# Patient Record
Sex: Male | Born: 1970 | Race: White | Hispanic: No | Smoking: Never smoker
Health system: Southern US, Community
[De-identification: ages and names within clinical notes are randomized; demographics above are authoritative.]

## PROBLEM LIST (undated history)

## (undated) DIAGNOSIS — I10 Essential (primary) hypertension: Secondary | ICD-10-CM

## (undated) DIAGNOSIS — M549 Dorsalgia, unspecified: Secondary | ICD-10-CM

## (undated) HISTORY — PX: ORTHOPEDIC SURGERY: SHX850

## (undated) HISTORY — PX: OTHER SURGICAL HISTORY: SHX169

---

## 2003-08-17 ENCOUNTER — Emergency Department (HOSPITAL_COMMUNITY): Admission: AD | Admit: 2003-08-17 | Discharge: 2003-08-17 | Payer: Self-pay | Admitting: Family Medicine

## 2004-01-27 ENCOUNTER — Emergency Department (HOSPITAL_COMMUNITY): Admission: EM | Admit: 2004-01-27 | Discharge: 2004-01-27 | Payer: Self-pay | Admitting: Family Medicine

## 2004-04-24 ENCOUNTER — Emergency Department (HOSPITAL_COMMUNITY): Admission: EM | Admit: 2004-04-24 | Discharge: 2004-04-24 | Payer: Self-pay | Admitting: Family Medicine

## 2006-04-20 ENCOUNTER — Emergency Department (HOSPITAL_COMMUNITY): Admission: EM | Admit: 2006-04-20 | Discharge: 2006-04-20 | Payer: Self-pay | Admitting: Emergency Medicine

## 2013-04-20 ENCOUNTER — Emergency Department (HOSPITAL_COMMUNITY): Payer: 59

## 2013-04-20 ENCOUNTER — Emergency Department (HOSPITAL_COMMUNITY)
Admission: EM | Admit: 2013-04-20 | Discharge: 2013-04-20 | Disposition: A | Discharge status: Home / Self Care | Payer: 59 | Attending: Emergency Medicine | Admitting: Emergency Medicine

## 2013-04-20 ENCOUNTER — Encounter (HOSPITAL_COMMUNITY): Payer: Self-pay

## 2013-04-20 DIAGNOSIS — M549 Dorsalgia, unspecified: Secondary | ICD-10-CM

## 2013-04-20 DIAGNOSIS — M545 Low back pain, unspecified: Secondary | ICD-10-CM | POA: Insufficient documentation

## 2013-04-20 DIAGNOSIS — Z79899 Other long term (current) drug therapy: Secondary | ICD-10-CM | POA: Insufficient documentation

## 2013-04-20 DIAGNOSIS — R110 Nausea: Secondary | ICD-10-CM | POA: Insufficient documentation

## 2013-04-20 HISTORY — DX: Dorsalgia, unspecified: M54.9

## 2013-04-20 LAB — CBC WITH DIFFERENTIAL/PLATELET
Hemoglobin: 13.8 g/dL (ref 13.0–17.0)
Lymphocytes Relative: 29 % (ref 12–46)
Lymphs Abs: 2.1 10*3/uL (ref 0.7–4.0)
MCH: 30.5 pg (ref 26.0–34.0)
Monocytes Relative: 9 % (ref 3–12)
Neutro Abs: 4 10*3/uL (ref 1.7–7.7)
Neutrophils Relative %: 56 % (ref 43–77)
Platelets: 196 10*3/uL (ref 150–400)
RBC: 4.53 MIL/uL (ref 4.22–5.81)
WBC: 7.2 10*3/uL (ref 4.0–10.5)

## 2013-04-20 LAB — BASIC METABOLIC PANEL
BUN: 13 mg/dL (ref 6–23)
CO2: 27 mEq/L (ref 19–32)
Chloride: 104 mEq/L (ref 96–112)
Glucose, Bld: 88 mg/dL (ref 70–99)
Potassium: 3.5 mEq/L (ref 3.5–5.1)
Sodium: 140 mEq/L (ref 135–145)

## 2013-04-20 LAB — URINALYSIS, ROUTINE W REFLEX MICROSCOPIC
Bilirubin Urine: NEGATIVE
Glucose, UA: NEGATIVE mg/dL
Specific Gravity, Urine: >1.03 — ABNORMAL HIGH (ref 1.005–1.030)
Urobilinogen, UA: 0.2 mg/dL (ref 0.0–1.0)

## 2013-04-20 LAB — URINE MICROSCOPIC-ADD ON

## 2013-04-20 MED ORDER — HYDROMORPHONE HCL PF 1 MG/ML IJ SOLN
1.0000 mg | Freq: Once | INTRAMUSCULAR | Status: AC
  Administered 2013-04-20: 1 mg via INTRAVENOUS

## 2013-04-20 MED ORDER — HYDROMORPHONE HCL PF 1 MG/ML IJ SOLN
INTRAMUSCULAR | Status: AC
  Administered 2013-04-20: 1 mg
  Filled 2013-04-20: qty 1

## 2013-04-20 MED ORDER — HYDROMORPHONE HCL 2 MG PO TABS: 2.0000 mg | ORAL_TABLET | ORAL | Status: DC | PRN

## 2013-04-20 MED ORDER — DIAZEPAM 5 MG PO TABS: 5.0000 mg | ORAL_TABLET | Freq: Two times a day (BID) | ORAL | Status: DC

## 2013-04-20 MED ORDER — SODIUM CHLORIDE 0.9 % IV BOLUS (SEPSIS)
1000.0000 mL | Freq: Once | INTRAVENOUS | Status: AC
  Administered 2013-04-20: 1000 mL via INTRAVENOUS

## 2013-04-20 MED ORDER — ONDANSETRON HCL 4 MG/2ML IJ SOLN: 4.0000 mg | Freq: Once | INTRAMUSCULAR | Status: DC

## 2013-04-20 MED ORDER — METHYLPREDNISOLONE 4 MG PO KIT: PACK | ORAL | Status: DC

## 2013-04-20 MED ORDER — CYCLOBENZAPRINE HCL 10 MG PO TABS: 10.0000 mg | ORAL_TABLET | Freq: Once | ORAL | Status: DC

## 2013-04-20 MED ORDER — HYDROMORPHONE HCL PF 1 MG/ML IJ SOLN
INTRAMUSCULAR | Status: AC
  Filled 2013-04-20: qty 1

## 2013-04-20 MED ORDER — DIAZEPAM 5 MG PO TABS
5.0000 mg | ORAL_TABLET | Freq: Once | ORAL | Status: AC
  Administered 2013-04-20: 5 mg via ORAL
  Filled 2013-04-20: qty 1

## 2013-04-20 MED ORDER — ONDANSETRON HCL 4 MG/2ML IJ SOLN
INTRAMUSCULAR | Status: AC
  Filled 2013-04-20: qty 2

## 2013-04-20 MED ORDER — HYDROMORPHONE HCL PF 1 MG/ML IJ SOLN: 1.0000 mg | Freq: Once | INTRAMUSCULAR | Status: DC

## 2013-04-20 MED ORDER — ONDANSETRON HCL 4 MG/2ML IJ SOLN
4.0000 mg | Freq: Once | INTRAMUSCULAR | Status: AC
  Administered 2013-04-20: 4 mg via INTRAVENOUS

## 2013-04-20 MED ORDER — HYDROMORPHONE HCL PF 1 MG/ML IJ SOLN
1.0000 mg | Freq: Once | INTRAMUSCULAR | Status: AC
  Administered 2013-04-20: 1 mg via INTRAVENOUS
  Filled 2013-04-20: qty 1

## 2013-04-20 MED ORDER — SODIUM CHLORIDE 0.9 % IV SOLN: INTRAVENOUS | Status: DC

## 2013-04-20 MED ORDER — ONDANSETRON HCL 4 MG/2ML IJ SOLN
INTRAMUSCULAR | Status: AC
  Administered 2013-04-20: 4 mg
  Filled 2013-04-20: qty 2

## 2013-04-20 NOTE — ED Notes (Signed)
Pt states he attempted to stand and was unable. States the pain shots up to a 10/10 when he moves

## 2013-04-20 NOTE — Discharge Instructions (Signed)
Back Pain, Adult  Low back pain is very common. About 1 in 5 people have back pain. The cause of low back pain is rarely dangerous. The pain often gets better over time. About half of people with a sudden onset of back pain feel better in just 2 weeks. About 8 in 10 people feel better by 6 weeks.   CAUSES  Some common causes of back pain include:  · Strain of the muscles or ligaments supporting the spine.  · Wear and tear (degeneration) of the spinal discs.  · Arthritis.  · Direct injury to the back.  DIAGNOSIS  Most of the time, the direct cause of low back pain is not known. However, back pain can be treated effectively even when the exact cause of the pain is unknown. Answering your caregiver's questions about your overall health and symptoms is one of the most accurate ways to make sure the cause of your pain is not dangerous. If your caregiver needs more information, he or she may order lab work or imaging tests (X-rays or MRIs). However, even if imaging tests show changes in your back, this usually does not require surgery.  HOME CARE INSTRUCTIONS  For many people, back pain returns. Since low back pain is rarely dangerous, it is often a condition that people can learn to manage on their own.   · Remain active. It is stressful on the back to sit or stand in one place. Do not sit, drive, or stand in one place for more than 30 minutes at a time. Take short walks on level surfaces as soon as pain allows. Try to increase the length of time you walk each day.  · Do not stay in bed. Resting more than 1 or 2 days can delay your recovery.  · Do not avoid exercise or work. Your body is made to move. It is not dangerous to be active, even though your back may hurt. Your back will likely heal faster if you return to being active before your pain is gone.  · Pay attention to your body when you  bend and lift. Many people have less discomfort when lifting if they bend their knees, keep the load close to their bodies, and  avoid twisting. Often, the most comfortable positions are those that put less stress on your recovering back.  · Find a comfortable position to sleep. Use a firm mattress and lie on your side with your knees slightly bent. If you lie on your back, put a pillow under your knees.  · Only take over-the-counter or prescription medicines as directed by your caregiver. Over-the-counter medicines to reduce pain and inflammation are often the most helpful. Your caregiver may prescribe muscle relaxant drugs. These medicines help dull your pain so you can more quickly return to your normal activities and healthy exercise.  · Put ice on the injured area.  · Put ice in a plastic bag.  · Place a towel between your skin and the bag.  · Leave the ice on for 15-20 minutes, 3-4 times a day for the first 2 to 3 days. After that, ice and heat may be alternated to reduce pain and spasms.  · Ask your caregiver about trying back exercises and gentle massage. This may be of some benefit.  · Avoid feeling anxious or stressed. Stress increases muscle tension and can worsen back pain. It is important to recognize when you are anxious or stressed and learn ways to manage it. Exercise is a great option.  SEEK MEDICAL CARE IF:  · You have pain that is not relieved with rest or   medicine.  · You have pain that does not improve in 1 week.  · You have new symptoms.  · You are generally not feeling well.  SEEK IMMEDIATE MEDICAL CARE IF:   · You have pain that radiates from your back into your legs.  · You develop new bowel or bladder control problems.  · You have unusual weakness or numbness in your arms or legs.  · You develop nausea or vomiting.  · You develop abdominal pain.  · You feel faint.  Document Released: 07/08/2005 Document Revised: 01/07/2012 Document Reviewed: 11/26/2010  [employer]® Patient Information ©2014 [employer].

## 2013-04-20 NOTE — ED Provider Notes (Signed)
CSN: [id number]     Arrival date & time 04/20/13  9562 History   First MD Initiated Contact with Patient 04/20/13 1009     Chief Complaint  Patient presents with  . Back Pain   (Consider location/radiation/quality/duration/timing/severity/associated sxs/prior Treatment) Patient is a 42 y.o. male presenting with back pain.  Back Pain Location:  Lumbar spine Quality:  Burning and shooting Radiates to:  R thigh Pain severity:  Severe Pain is:  Same all the time Onset quality:  Gradual Duration:  1 week Timing:  Constant Progression:  Worsening Chronicity:  New Relieved by:  Nothing Worsened by:  Movement Ineffective treatments:  Ibuprofen Associated symptoms: no abdominal pain, no chest pain, no dysuria, no fever and no headaches    Russell Blackwell is a 42 y.o. male who presents to the ED with low back pain. The pain started a week ago and he has been seeing a [facility] but no improvement. Today he was to return but when he got to the office he could not get out of the car due to severe pain. The doctor told him to come to the ED for evaluation for possible ruptured disc. The patient is unable to stand due to pain. The pain goes down the right leg.     Past Medical History  Diagnosis Date  . Back pain    Past Surgical History  Procedure Laterality Date  . Orthopedic surgery     No family history on file. History  Substance Use Topics  . Smoking status: Not on file  . Smokeless tobacco: Not on file  . Alcohol Use: Not on file    Review of Systems  Constitutional: Negative for fever and chills.  HENT: Negative for neck pain.   Respiratory: Negative for shortness of breath.   Cardiovascular: Negative for chest pain.  Gastrointestinal: Positive for nausea (this am when the pain hit). Negative for vomiting and abdominal pain.  Genitourinary: Negative for dysuria, urgency and frequency.  Musculoskeletal: Positive for back pain.  Skin: Negative for wound.  Neurological:  Negative for light-headedness and headaches.  Psychiatric/Behavioral: The patient is not nervous/anxious.     Allergies  Review of patient's allergies indicates no known allergies.  Home Medications   Current Outpatient Rx  Name  Route  Sig  Dispense  Refill  . ibuprofen (ADVIL,MOTRIN) 600 MG tablet   Oral   Take 600 mg by mouth every 6 (six) hours as needed for pain.         Marland Kitchen PRESCRIPTION MEDICATION   Topical   Apply 1 application topically daily as needed (psoriasis). Psoriasis cream          BP 136/78  Pulse 84  Temp(Src) 98.3 F (36.8 C)  Resp 18  SpO2 100% Physical Exam  Nursing note and vitals reviewed. Constitutional: He is oriented to person, place, and time. He appears well-developed and well-nourished. No distress.  Appears very uncomfortable  HENT:  Head: Normocephalic and atraumatic.  Eyes: Conjunctivae and EOM are normal.  Neck: Normal range of motion. Neck supple.  Cardiovascular: Normal rate.   Pulmonary/Chest: Effort normal.  Abdominal: Soft. Bowel sounds are normal. There is no tenderness.  Musculoskeletal:       Lumbar back: He exhibits decreased range of motion and tenderness.       Back:  Unable to complete exam due to severe pain the patient is having.  Neurological: He is alert and oriented to person, place, and time. No cranial nerve deficit.  Skin:  Skin is warm and dry.  Psychiatric: He has a normal mood and affect. His behavior is normal.    ED Course: Patient has been unable to void until 13:45.   Procedures  Results for orders placed during the hospital encounter of 04/20/13 (from the past 24 hour(s))  URINALYSIS, ROUTINE W REFLEX MICROSCOPIC     Status: Abnormal   Collection Time    04/20/13  1:49 PM      Result Value Range   Color, Urine YELLOW  YELLOW   APPearance CLEAR  CLEAR   Specific Gravity, Urine >1.030 (*) 1.005 - 1.030   pH 6.0  5.0 - 8.0   Glucose, UA NEGATIVE  NEGATIVE mg/dL   Hgb urine dipstick LARGE (*) NEGATIVE    Bilirubin Urine NEGATIVE  NEGATIVE   Ketones, ur NEGATIVE  NEGATIVE mg/dL   Protein, ur TRACE (*) NEGATIVE mg/dL   Urobilinogen, UA 0.2  0.0 - 1.0 mg/dL   Nitrite NEGATIVE  NEGATIVE   Leukocytes, UA NEGATIVE  NEGATIVE  URINE MICROSCOPIC-ADD ON     Status: Abnormal   Collection Time    04/20/13  1:49 PM      Result Value Range   WBC, UA 0-2  <3 WBC/hpf   RBC / HPF 11-20  <3 RBC/hpf   Bacteria, UA FEW (*) RARE    Imaging Review Mr Lumbar Spine Wo Contrast  04/20/2013   CLINICAL DATA:  Low back pain with inability to walk. No known injury.  EXAM: MRI LUMBAR SPINE WITHOUT CONTRAST  TECHNIQUE: Multiplanar, multisequence MR imaging was performed. No intravenous contrast was administered.  COMPARISON:  None.  FINDINGS: Five lumbar type vertebral bodies are assumed. The alignment is normal. There is no evidence of fracture or pars defect.  The conus medullaris extends to the L1-2 level and appears normal. No paraspinal abnormalities are identified.  There is mild disc bulging with loss of disc height at T12-L1. No spinal stenosis or nerve root encroachment results.  No significant disc space findings are present from L1-2 through L3-4.  L4-5: There is mild disc bulging with mild facet and ligamentous hypertrophy. There is mild narrowing of the left lateral recess. There is no foraminal compromise or definite nerve root encroachment.  L5-S1: A broad-based central disc protrusion is largely contained within the ventral epidural fat. There is no sacral nerve root displacement. There is mild loss of disc height with facet and ligamentous hypertrophy. The foramina are sufficiently patent.  IMPRESSION: 1. Broad-based central disc protrusion at L5-S1 causes no sacral nerve root displacement or compression. Mild facet hypertrophy is present bilaterally. 2. Mild disc bulging and facet hypertrophy at L4-5 with mild resulting left lateral recess stenosis. 3. Mild disc degeneration at T12-L1   Electronically Signed    By: Roxy Horseman   On: 04/20/2013 11:46    MDM: Patient given dilaudid, valium and continues to have pain. Discussed with Dr. Deretha Emory and he will evaluate the patient.   Since the patient has hematuria and high specific gravity will start IV fluids. Dr. Deretha Emory discussed with the patient possibility of kidney stone and will do CT abdomen and pelvis to evaluate. Patient agrees with plan. Patient beginning to have some relief after Valium and  third dose of Dilaudid. Dr. Deretha Emory assumed care of the patient.     Janne Napoleon, NP 04/20/13 1556

## 2013-04-20 NOTE — ED Provider Notes (Signed)
Medical screening examination/treatment/procedure(s) were conducted as a shared visit with non-physician practitioner(s) and myself.  I personally evaluated the patient during the encounter  Patient seen by me. Patient with history of chronic back pain problems. And patient felt he had an exacerbation in the last 2 days with significant pain in the right lower part of the back. Today's MRI shows abnormalities but no direct nerve compression on any of the lumbar sciatic nerve roots. Also no evidence of any cauda equina problem. We'll go ahead and get CT of the abdomen to rule out kidney stone patient's urinalysis had significant hematuria 11-20 red blood cells. Patient does not have a history of kidney stones in the past perhaps the exacerbation of the back is related to that. If that is negative for kidney stones patient can be discharged home with followup with Dr. Clover Mealy of from the spine clinic. Patient will need a work note to be off for a week would probably do best on Percocet Valium and consideration for Naprosyn or prednisone pack.   Results for orders placed during the hospital encounter of 04/20/13  URINALYSIS, ROUTINE W REFLEX MICROSCOPIC      Result Value Range   Color, Urine YELLOW  YELLOW   APPearance CLEAR  CLEAR   Specific Gravity, Urine >1.030 (*) 1.005 - 1.030   pH 6.0  5.0 - 8.0   Glucose, UA NEGATIVE  NEGATIVE mg/dL   Hgb urine dipstick LARGE (*) NEGATIVE   Bilirubin Urine NEGATIVE  NEGATIVE   Ketones, ur NEGATIVE  NEGATIVE mg/dL   Protein, ur TRACE (*) NEGATIVE mg/dL   Urobilinogen, UA 0.2  0.0 - 1.0 mg/dL   Nitrite NEGATIVE  NEGATIVE   Leukocytes, UA NEGATIVE  NEGATIVE  URINE MICROSCOPIC-ADD ON      Result Value Range   WBC, UA 0-2  <3 WBC/hpf   RBC / HPF 11-20  <3 RBC/hpf   Bacteria, UA FEW (*) RARE   Mr Lumbar Spine Wo Contrast  04/20/2013   CLINICAL DATA:  Low back pain with inability to walk. No known injury.  EXAM: MRI LUMBAR SPINE WITHOUT CONTRAST  TECHNIQUE:  Multiplanar, multisequence MR imaging was performed. No intravenous contrast was administered.  COMPARISON:  None.  FINDINGS: Five lumbar type vertebral bodies are assumed. The alignment is normal. There is no evidence of fracture or pars defect.  The conus medullaris extends to the L1-2 level and appears normal. No paraspinal abnormalities are identified.  There is mild disc bulging with loss of disc height at T12-L1. No spinal stenosis or nerve root encroachment results.  No significant disc space findings are present from L1-2 through L3-4.  L4-5: There is mild disc bulging with mild facet and ligamentous hypertrophy. There is mild narrowing of the left lateral recess. There is no foraminal compromise or definite nerve root encroachment.  L5-S1: A broad-based central disc protrusion is largely contained within the ventral epidural fat. There is no sacral nerve root displacement. There is mild loss of disc height with facet and ligamentous hypertrophy. The foramina are sufficiently patent.  IMPRESSION: 1. Broad-based central disc protrusion at L5-S1 causes no sacral nerve root displacement or compression. Mild facet hypertrophy is present bilaterally. 2. Mild disc bulging and facet hypertrophy at L4-5 with mild resulting left lateral recess stenosis. 3. Mild disc degeneration at T12-L1   Electronically Signed   By: Roxy Horseman   On: 04/20/2013 11:46      Shelda Jakes, MD 04/20/13 1525

## 2013-04-20 NOTE — ED Notes (Signed)
Complain of pain in lower back with radiation down right leg

## 2013-04-20 NOTE — ED Notes (Signed)
Pt request more pain medication. EDNP aware

## 2014-02-16 ENCOUNTER — Emergency Department (HOSPITAL_COMMUNITY)
Admission: EM | Admit: 2014-02-16 | Discharge: 2014-02-16 | Disposition: A | Discharge status: Home / Self Care | Payer: 59 | Attending: Emergency Medicine | Admitting: Emergency Medicine

## 2014-02-16 ENCOUNTER — Emergency Department (HOSPITAL_COMMUNITY): Payer: 59

## 2014-02-16 ENCOUNTER — Encounter (HOSPITAL_COMMUNITY): Payer: Self-pay | Admitting: Emergency Medicine

## 2014-02-16 DIAGNOSIS — R079 Chest pain, unspecified: Secondary | ICD-10-CM | POA: Insufficient documentation

## 2014-02-16 DIAGNOSIS — M549 Dorsalgia, unspecified: Secondary | ICD-10-CM | POA: Insufficient documentation

## 2014-02-16 DIAGNOSIS — IMO0002 Reserved for concepts with insufficient information to code with codable children: Secondary | ICD-10-CM | POA: Diagnosis not present

## 2014-02-16 DIAGNOSIS — Z79899 Other long term (current) drug therapy: Secondary | ICD-10-CM | POA: Insufficient documentation

## 2014-02-16 LAB — CBC WITH DIFFERENTIAL/PLATELET
BASOS ABS: 0 10*3/uL (ref 0.0–0.1)
Basophils Relative: 0 % (ref 0–1)
EOS PCT: 4 % (ref 0–5)
Eosinophils Absolute: 0.4 10*3/uL (ref 0.0–0.7)
HCT: 39.5 % (ref 39.0–52.0)
Hemoglobin: 14.1 g/dL (ref 13.0–17.0)
LYMPHS PCT: 19 % (ref 12–46)
Lymphs Abs: 1.7 10*3/uL (ref 0.7–4.0)
MCH: 30.5 pg (ref 26.0–34.0)
MCHC: 35.7 g/dL (ref 30.0–36.0)
MCV: 85.3 fL (ref 78.0–100.0)
Monocytes Absolute: 1 10*3/uL (ref 0.1–1.0)
Monocytes Relative: 11 % (ref 3–12)
NEUTROS ABS: 6.2 10*3/uL (ref 1.7–7.7)
NEUTROS PCT: 66 % (ref 43–77)
PLATELETS: 210 10*3/uL (ref 150–400)
RBC: 4.63 MIL/uL (ref 4.22–5.81)
RDW: 12.4 % (ref 11.5–15.5)
WBC: 9.3 10*3/uL (ref 4.0–10.5)

## 2014-02-16 LAB — COMPREHENSIVE METABOLIC PANEL
ALT: 29 U/L (ref 0–53)
AST: 22 U/L (ref 0–37)
Albumin: 3.9 g/dL (ref 3.5–5.2)
Alkaline Phosphatase: 79 U/L (ref 39–117)
Anion gap: 12 (ref 5–15)
BUN: 16 mg/dL (ref 6–23)
CALCIUM: 9.4 mg/dL (ref 8.4–10.5)
CO2: 25 meq/L (ref 19–32)
Chloride: 101 mEq/L (ref 96–112)
Creatinine, Ser: 0.98 mg/dL (ref 0.50–1.35)
GFR calc Af Amer: >90 mL/min (ref >90)
Glucose, Bld: 103 mg/dL — ABNORMAL HIGH (ref 70–99)
Potassium: 4.1 mEq/L (ref 3.7–5.3)
SODIUM: 138 meq/L (ref 137–147)
TOTAL PROTEIN: 7.5 g/dL (ref 6.0–8.3)
Total Bilirubin: 0.9 mg/dL (ref 0.3–1.2)

## 2014-02-16 LAB — LIPASE, BLOOD: Lipase: 33 U/L (ref 11–59)

## 2014-02-16 MED ORDER — ONDANSETRON HCL 4 MG/2ML IJ SOLN
4.0000 mg | Freq: Once | INTRAMUSCULAR | Status: AC
  Administered 2014-02-16: 4 mg via INTRAVENOUS
  Filled 2014-02-16: qty 2

## 2014-02-16 MED ORDER — CYCLOBENZAPRINE HCL 10 MG PO TABS: 10.0000 mg | ORAL_TABLET | Freq: Three times a day (TID) | ORAL | Status: DC | PRN

## 2014-02-16 MED ORDER — CYCLOBENZAPRINE HCL 10 MG PO TABS
10.0000 mg | ORAL_TABLET | Freq: Once | ORAL | Status: AC
  Administered 2014-02-16: 10 mg via ORAL
  Filled 2014-02-16: qty 1

## 2014-02-16 MED ORDER — HYDROMORPHONE HCL PF 1 MG/ML IJ SOLN
1.0000 mg | Freq: Once | INTRAMUSCULAR | Status: AC
  Administered 2014-02-16: 1 mg via INTRAVENOUS
  Filled 2014-02-16: qty 1

## 2014-02-16 MED ORDER — OXYCODONE-ACETAMINOPHEN 5-325 MG PO TABS: 1.0000 | ORAL_TABLET | ORAL | Status: DC | PRN

## 2014-02-16 NOTE — Discharge Instructions (Signed)
Chest Pain (Nonspecific) °It is often hard to give a specific diagnosis for the cause of chest pain. There is always a chance that your pain could be related to something serious, such as a heart attack or a blood clot in the lungs. You need to follow up with your health care provider for further evaluation. °CAUSES  °· Heartburn. °· Pneumonia or bronchitis. °· Anxiety or stress. °· Inflammation around your heart (pericarditis) or lung (pleuritis or pleurisy). °· A blood clot in the lung. °· A collapsed lung (pneumothorax). It can develop suddenly on its own (spontaneous pneumothorax) or from trauma to the chest. °· Shingles infection (herpes zoster virus). °The chest wall is composed of bones, muscles, and cartilage. Any of these can be the source of the pain. °· The bones can be bruised by injury. °· The muscles or cartilage can be strained by coughing or overwork. °· The cartilage can be affected by inflammation and become sore (costochondritis). °DIAGNOSIS  °Lab tests or other studies may be needed to find the cause of your pain. Your health care provider may have you take a test called an ambulatory electrocardiogram (ECG). An ECG records your heartbeat patterns over a 24-hour period. You may also have other tests, such as: °· Transthoracic echocardiogram (TTE). During echocardiography, sound waves are used to evaluate how blood flows through your heart. °· Transesophageal echocardiogram (TEE). °· Cardiac monitoring. This allows your health care provider to monitor your heart rate and rhythm in real time. °· Holter monitor. This is a portable device that records your heartbeat and can help diagnose heart arrhythmias. It allows your health care provider to track your heart activity for several days, if needed. °· Stress tests by exercise or by giving medicine that makes the heart beat faster. °TREATMENT  °· Treatment depends on what may be causing your chest pain. Treatment may include: °¨ Acid blockers for  heartburn. °¨ Anti-inflammatory medicine. °¨ Pain medicine for inflammatory conditions. °¨ Antibiotics if an infection is present. °· You may be advised to change lifestyle habits. This includes stopping smoking and avoiding alcohol, caffeine, and chocolate. °· You may be advised to keep your head raised (elevated) when sleeping. This reduces the chance of acid going backward from your stomach into your esophagus. °Most of the time, nonspecific chest pain will improve within 2-3 days with rest and mild pain medicine.  °HOME CARE INSTRUCTIONS  °· If antibiotics were prescribed, take them as directed. Finish them even if you start to feel better. °· For the next few days, avoid physical activities that bring on chest pain. Continue physical activities as directed. °· Do not use any tobacco products, including cigarettes, chewing tobacco, or electronic cigarettes. °· Avoid drinking alcohol. °· Only take medicine as directed by your health care provider. °· Follow your health care provider's suggestions for further testing if your chest pain does not go away. °· Keep any follow-up appointments you made. If you do not go to an appointment, you could develop lasting (chronic) problems with pain. If there is any problem keeping an appointment, call to reschedule. °SEEK MEDICAL CARE IF:  °· Your chest pain does not go away, even after treatment. °· You have a rash with blisters on your chest. °· You have a fever. °SEEK IMMEDIATE MEDICAL CARE IF:  °· You have increased chest pain or pain that spreads to your arm, neck, jaw, back, or abdomen. °· You have shortness of breath. °· You have an increasing cough, or you cough   up blood.  You have severe back or abdominal pain.  You feel nauseous or vomit.  You have severe weakness.  You faint.  You have chills. This is an emergency. Do not wait to see if the pain will go away. Get medical help at once. Call your local emergency services (911 in [place] Do not drive  yourself to the hospital. MAKE SURE YOU:   Understand these instructions.  Will watch your condition.  Will get help right away if you are not doing well or get worse. Document Released: 04/17/2005 Document Revised: 07/13/2013 Document Reviewed: 02/11/2008 [facility] Patient Information 2015 [place], [place]. This information is not intended to replace advice given to you by your health care provider. Make sure you discuss any questions you have with your health care provider.  Cyclobenzaprine tablets What is this medicine? CYCLOBENZAPRINE (sye kloe BEN za preen) is a muscle relaxer. It is used to treat muscle pain, spasms, and stiffness. This medicine may be used for other purposes; ask your health care provider or pharmacist if you have questions. COMMON BRAND NAME(S): Fexmid, Flexeril What should I tell my health care provider before I take this medicine? They need to know if you have any of these conditions: -heart disease, irregular heartbeat, or previous heart attack -liver disease -thyroid problem -an unusual or allergic reaction to cyclobenzaprine, tricyclic antidepressants, lactose, other medicines, foods, dyes, or preservatives -pregnant or trying to get pregnant -breast-feeding How should I use this medicine? Take this medicine by mouth with a glass of water. Follow the directions on the prescription label. If this medicine upsets your stomach, take it with food or milk. Take your medicine at regular intervals. Do not take it more often than directed. Talk to your pediatrician regarding the use of this medicine in children. Special care may be needed. Overdosage: If you think you have taken too much of this medicine contact a poison control center or emergency room at once. NOTE: This medicine is only for you. Do not share this medicine with others. What if I miss a dose? If you miss a dose, take it as soon as you can. If it is almost time for your next dose, take only that  dose. Do not take double or extra doses. What may interact with this medicine? Do not take this medicine with any of the following medications: -certain medicines for fungal infections like fluconazole, itraconazole, ketoconazole, posaconazole, voriconazole -cisapride -dofetilide -dronedarone -droperidol -flecainide -grepafloxacin -halofantrine -levomethadyl -MAOIs like Carbex, Eldepryl, Marplan, Nardil, and Parnate -nilotinib -pimozide -probucol -sertindole -thioridazine -ziprasidone This medicine may also interact with the following medications: -abarelix -alcohol -certain medicines for cancer -certain medicines for depression, anxiety, or psychotic disturbances -certain medicines for infection like alfuzosin, chloroquine, clarithromycin, levofloxacin, mefloquine, pentamidine, troleandomycin -certain medicines for an irregular heart beat -certain medicines used for sleep or numbness during surgery or procedure -contrast dyes -dolasetron -guanethidine -methadone -octreotide -ondansetron -other medicines that prolong the QT interval (cause an abnormal heart rhythm) -palonosetron -phenothiazines like chlorpromazine, mesoridazine, prochlorperazine, thioridazine -tramadol -vardenafil This list may not describe all possible interactions. Give your health care provider a list of all the medicines, herbs, non-prescription drugs, or dietary supplements you use. Also tell them if you smoke, drink alcohol, or use illegal drugs. Some items may interact with your medicine. What should I watch for while using this medicine? Check with your doctor or health care professional if your condition does not improve within 1 to 3 weeks. You may get drowsy or dizzy when you  first start taking the medicine or change doses. Do not drive, use machinery, or do anything that may be dangerous until you know how the medicine affects you. Stand or sit up slowly. Your mouth may get dry. Drinking water,  chewing sugarless gum, or sucking on hard candy may help. What side effects may I notice from receiving this medicine? Side effects that you should report to your doctor or health care professional as soon as possible: -allergic reactions like skin rash, itching or hives, swelling of the face, lips, or tongue -chest pain -fast heartbeat -hallucinations -seizures -vomiting Side effects that usually do not require medical attention (report to your doctor or health care professional if they continue or are bothersome): -headache This list may not describe all possible side effects. Call your doctor for medical advice about side effects. You may report side effects to FDA at 1-800-FDA-1088. Where should I keep my medicine? Keep out of the reach of children. Store at room temperature between 15 and 30 degrees C (59 and 86 degrees F). Keep container tightly closed. Throw away any unused medicine after the expiration date. NOTE: This sheet is a summary. It may not cover all possible information. If you have questions about this medicine, talk to your doctor, pharmacist, or health care provider.  2015, [employer] Standard. (2013-02-02 12:48:19)  Acetaminophen; Oxycodone tablets What is this medicine? ACETAMINOPHEN; OXYCODONE (a set a MEE noe fen; ox i KOE done) is a pain reliever. It is used to treat mild to moderate pain. This medicine may be used for other purposes; ask your health care provider or pharmacist if you have questions. COMMON BRAND NAME(S): Endocet, Magnacet, Narvox, Percocet, Perloxx, Primalev, Primlev, Roxicet, Xolox What should I tell my health care provider before I take this medicine? They need to know if you have any of these conditions: -brain tumor -Crohn's disease, inflammatory bowel disease, or ulcerative colitis -drug abuse or addiction -head injury -heart or circulation problems -if you often drink alcohol -kidney disease or problems going to the bathroom -liver  disease -lung disease, asthma, or breathing problems -an unusual or allergic reaction to acetaminophen, oxycodone, other opioid analgesics, other medicines, foods, dyes, or preservatives -pregnant or trying to get pregnant -breast-feeding How should I use this medicine? Take this medicine by mouth with a full glass of water. Follow the directions on the prescription label. Take your medicine at regular intervals. Do not take your medicine more often than directed. Talk to your pediatrician regarding the use of this medicine in children. Special care may be needed. Patients over 68 years old may have a stronger reaction and need a smaller dose. Overdosage: If you think you have taken too much of this medicine contact a poison control center or emergency room at once. NOTE: This medicine is only for you. Do not share this medicine with others. What if I miss a dose? If you miss a dose, take it as soon as you can. If it is almost time for your next dose, take only that dose. Do not take double or extra doses. What may interact with this medicine? -alcohol -antihistamines -barbiturates like amobarbital, butalbital, butabarbital, methohexital, pentobarbital, phenobarbital, thiopental, and secobarbital -benztropine -drugs for bladder problems like solifenacin, trospium, oxybutynin, tolterodine, hyoscyamine, and methscopolamine -drugs for breathing problems like ipratropium and tiotropium -drugs for certain stomach or intestine problems like propantheline, homatropine methylbromide, glycopyrrolate, atropine, belladonna, and dicyclomine -general anesthetics like etomidate, ketamine, nitrous oxide, propofol, desflurane, enflurane, halothane, isoflurane, and sevoflurane -medicines for depression, anxiety,  or psychotic disturbances -medicines for sleep -muscle relaxants -naltrexone -narcotic medicines (opiates) for pain -phenothiazines like perphenazine, thioridazine, chlorpromazine, mesoridazine,  fluphenazine, prochlorperazine, promazine, and trifluoperazine -scopolamine -tramadol -trihexyphenidyl This list may not describe all possible interactions. Give your health care provider a list of all the medicines, herbs, non-prescription drugs, or dietary supplements you use. Also tell them if you smoke, drink alcohol, or use illegal drugs. Some items may interact with your medicine. What should I watch for while using this medicine? Tell your doctor or health care professional if your pain does not go away, if it gets worse, or if you have new or a different type of pain. You may develop tolerance to the medicine. Tolerance means that you will need a higher dose of the medication for pain relief. Tolerance is normal and is expected if you take this medicine for a long time. Do not suddenly stop taking your medicine because you may develop a severe reaction. Your body becomes used to the medicine. This does NOT mean you are addicted. Addiction is a behavior related to getting and using a drug for a non-medical reason. If you have pain, you have a medical reason to take pain medicine. Your doctor will tell you how much medicine to take. If your doctor wants you to stop the medicine, the dose will be slowly lowered over time to avoid any side effects. You may get drowsy or dizzy. Do not drive, use machinery, or do anything that needs mental alertness until you know how this medicine affects you. Do not stand or sit up quickly, especially if you are an older patient. This reduces the risk of dizzy or fainting spells. Alcohol may interfere with the effect of this medicine. Avoid alcoholic drinks. There are different types of narcotic medicines (opiates) for pain. If you take more than one type at the same time, you may have more side effects. Give your health care provider a list of all medicines you use. Your doctor will tell you how much medicine to take. Do not take more medicine than directed. Call  emergency for help if you have problems breathing. The medicine will cause constipation. Try to have a bowel movement at least every 2 to 3 days. If you do not have a bowel movement for 3 days, call your doctor or health care professional. Do not take Tylenol (acetaminophen) or medicines that have acetaminophen with this medicine. Too much acetaminophen can be very dangerous. Many nonprescription medicines contain acetaminophen. Always read the labels carefully to avoid taking more acetaminophen. What side effects may I notice from receiving this medicine? Side effects that you should report to your doctor or health care professional as soon as possible: -allergic reactions like skin rash, itching or hives, swelling of the face, lips, or tongue -breathing difficulties, wheezing -confusion -light headedness or fainting spells -severe stomach pain -unusually weak or tired -yellowing of the skin or the whites of the eyes Side effects that usually do not require medical attention (report to your doctor or health care professional if they continue or are bothersome): -dizziness -drowsiness -nausea -vomiting This list may not describe all possible side effects. Call your doctor for medical advice about side effects. You may report side effects to FDA at 1-800-FDA-1088. Where should I keep my medicine? Keep out of the reach of children. This medicine can be abused. Keep your medicine in a safe place to protect it from theft. Do not share this medicine with anyone. Selling or giving away this  medicine is dangerous and against the law. Store at room temperature between 20 and 25 degrees C (68 and 77 degrees F). Keep container tightly closed. Protect from light. This medicine may cause accidental overdose and death if it is taken by other adults, children, or pets. Flush any unused medicine down the toilet to reduce the chance of harm. Do not use the medicine after the expiration date. NOTE: This sheet  is a summary. It may not cover all possible information. If you have questions about this medicine, talk to your doctor, pharmacist, or health care provider.  2015, [employer]/Gold Standard. (2013-03-01 13:17:35)

## 2014-02-16 NOTE — ED Provider Notes (Signed)
CSN: [id number]     Arrival date & time 02/16/14  0210 History   First MD Initiated Contact with Patient 02/16/14 [phone]     Chief Complaint  Patient presents with  . Back Pain     (Consider location/radiation/quality/duration/timing/severity/associated sxs/prior Treatment) Patient is a 43 y.o. male presenting with back pain. The history is provided by the patient.  Back Pain He noted onset at about 6 PM of pain in the right posterior lower rib cage which seems to be radiating around to the right lateral and anterior rib cage. The pain is sharp and tends to come in spasms. When present, he rates it an 8/10 but then will subside. Pain is worse mainly with movement in certain directions. He denies pain with deep breath he denies any cough or dyspnea. There's been no nausea or vomiting. He denies any radiation of pain to the lower back or abdomen he denies any dysuria. He has not had pain like this before. He did try taking ibuprofen with no relief.  Past Medical History  Diagnosis Date  . Back pain    Past Surgical History  Procedure Laterality Date  . Orthopedic surgery     Family History  Problem Relation Age of Onset  . Cancer Mother    History  Substance Use Topics  . Smoking status: Never Smoker   . Smokeless tobacco: Current User    Types: Snuff  . Alcohol Use: Yes     Comment: Socially    Review of Systems  Musculoskeletal: Positive for back pain.  All other systems reviewed and are negative.     Allergies  Review of patient's allergies indicates no known allergies.  Home Medications   Prior to Admission medications   Medication Sig Start Date End Date Taking? Authorizing Provider  diazepam (VALIUM) 5 MG tablet Take 1 tablet (5 mg total) by mouth 2 (two) times daily. 04/20/13   Rolland Porter, MD  HYDROmorphone (DILAUDID) 2 MG tablet Take 1 tablet (2 mg total) by mouth every 4 (four) hours as needed for pain. 04/20/13   Rolland Porter, MD  HYDROmorphone (DILAUDID) 2 MG  tablet Take 1 tablet (2 mg total) by mouth every 4 (four) hours as needed for pain. 04/20/13   Rolland Porter, MD  ibuprofen (ADVIL,MOTRIN) 600 MG tablet Take 600 mg by mouth every 6 (six) hours as needed for pain.    Historical Provider, MD  methylPREDNISolone (MEDROL DOSEPAK) 4 MG tablet 6 tabs po day 1, decrease by 1 tab per day 04/20/13   Rolland Porter, MD  methylPREDNISolone (MEDROL DOSEPAK) 4 MG tablet 6 po on day 1, decrease by 1 tab per day 04/20/13   Rolland Porter, MD  PRESCRIPTION MEDICATION Apply 1 application topically daily as needed (psoriasis). Psoriasis cream    Historical Provider, MD   BP 145/81  Pulse 74  Temp(Src) 98.3 F (36.8 C) (Oral)  Ht 5\' 10"  (1.778 m)  Wt 235 lb (106.595 kg)  BMI 33.72 kg/m2  SpO2 100% Physical Exam  Nursing note and vitals reviewed.  43 year old male, resting comfortably and in no acute distress. Vital signs are significant for mild hypertension with blood pressure 145/81. Oxygen saturation is 100%, which is normal. Head is normocephalic and atraumatic. PERRLA, EOMI. Oropharynx is clear. Neck is nontender and supple without adenopathy or JVD. Back is nontender and there is no CVA tenderness. Lungs are clear without rales, wheezes, or rhonchi. Chest is nontender. Heart has regular rate and rhythm without murmur. Abdomen  is soft, flat, nontender without masses or hepatosplenomegaly and peristalsis is normoactive. Extremities have no cyanosis or edema, full range of motion is present. Skin is warm and dry without rash. Neurologic: Mental status is normal, cranial nerves are intact, there are no motor or sensory deficits.  ED Course  Procedures (including critical care time) Labs Review Results for orders placed during the hospital encounter of 02/16/14  CBC WITH DIFFERENTIAL      Result Value Ref Range   WBC 9.3  4.0 - 10.5 K/uL   RBC 4.63  4.22 - 5.81 MIL/uL   Hemoglobin 14.1  13.0 - 17.0 g/dL   HCT 40.939.5  81.139.0 - 91.452.0 %   MCV 85.3  78.0 - 100.0 fL    MCH 30.5  26.0 - 34.0 pg   MCHC 35.7  30.0 - 36.0 g/dL   RDW 78.212.4  95.611.5 - 21.315.5 %   Platelets 210  150 - 400 K/uL   Neutrophils Relative % 66  43 - 77 %   Neutro Abs 6.2  1.7 - 7.7 K/uL   Lymphocytes Relative 19  12 - 46 %   Lymphs Abs 1.7  0.7 - 4.0 K/uL   Monocytes Relative 11  3 - 12 %   Monocytes Absolute 1.0  0.1 - 1.0 K/uL   Eosinophils Relative 4  0 - 5 %   Eosinophils Absolute 0.4  0.0 - 0.7 K/uL   Basophils Relative 0  0 - 1 %   Basophils Absolute 0.0  0.0 - 0.1 K/uL  COMPREHENSIVE METABOLIC PANEL      Result Value Ref Range   Sodium 138  137 - 147 mEq/L   Potassium 4.1  3.7 - 5.3 mEq/L   Chloride 101  96 - 112 mEq/L   CO2 25  19 - 32 mEq/L   Glucose, Bld 103 (*) 70 - 99 mg/dL   BUN 16  6 - 23 mg/dL   Creatinine, Ser 0.860.98  0.50 - 1.35 mg/dL   Calcium 9.4  8.4 - 57.810.5 mg/dL   Total Protein 7.5  6.0 - 8.3 g/dL   Albumin 3.9  3.5 - 5.2 g/dL   AST 22  0 - 37 U/L   ALT 29  0 - 53 U/L   Alkaline Phosphatase 79  39 - 117 U/L   Total Bilirubin 0.9  0.3 - 1.2 mg/dL   GFR calc non Af Amer >90  >90 mL/min   GFR calc Af Amer >90  >90 mL/min   Anion gap 12  5 - 15  LIPASE, BLOOD      Result Value Ref Range   Lipase 33  11 - 59 U/L   Imaging Review Dg Chest 2 View  02/16/2014   CLINICAL DATA:  Right chest pain.  EXAM: CHEST  2 VIEW  COMPARISON:  None available for comparison at time of study interpretation.  FINDINGS: The heart size and mediastinal contours are within normal limits. Both lungs are clear. The visualized skeletal structures are unremarkable.  IMPRESSION: No active cardiopulmonary disease.   Electronically Signed   By: Awilda Metroourtnay  Bloomer   On: 02/16/2014 03:16    MDM   Final diagnoses:  Chest pain, unspecified chest pain type    Right-sided chest pain of uncertain cause. Workup has been initiated. He is given a dose of hydromorphone for pain.  Workup is unremarkable. He got reasonable relief of pain from hydromorphone but states pain is starting to come back  and continues to come  in spasms. He is informed of results of workup and is discharged with prescriptions for cyclobenzaprine and oxycodone-acetaminophen. He is to return should symptoms worsen.  Dione Booze, MD 02/16/14 0400

## 2014-02-16 NOTE — ED Notes (Signed)
Pt has back pain that is radiating to right side.

## 2014-10-12 IMAGING — CT CT ABD-PELV W/O CM
2 of 4 series · 17 of 46 positions shown, 19 images · non-contrast
Comparison: None.

CLINICAL DATA: Right lower back pain

EXAM:
CT ABDOMEN AND PELVIS WITHOUT CONTRAST
TECHNIQUE: Multidetector CT imaging of the abdomen and pelvis was performed
following the standard protocol without intravenous contrast.

[Series 2: standard/full over (age)lbs 5.0 · axial · 0.74mm/px · z∈[-519,-109]mm · 14 of 90 slices shown, 16 images]
[im 4/90  soft-tissue]
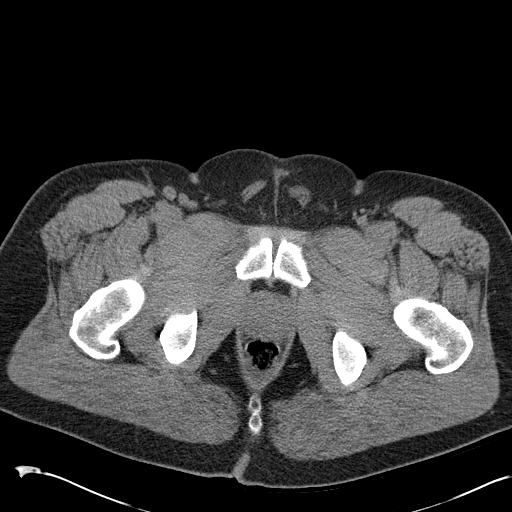
[im 4/90  bone]
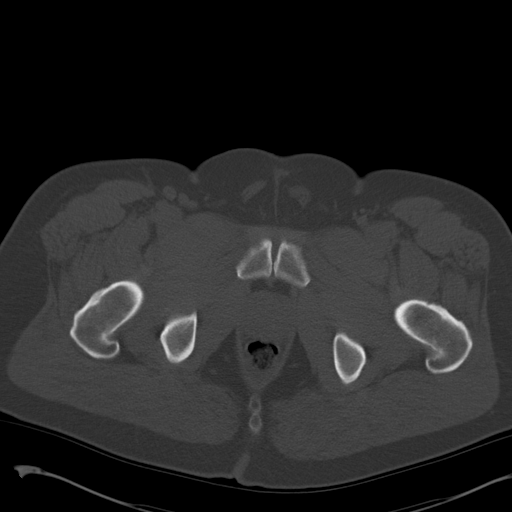
[im 11/90  soft-tissue]
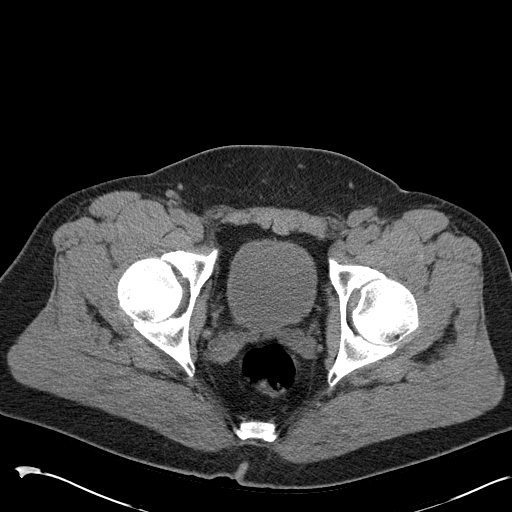
[im 18/90  soft-tissue]
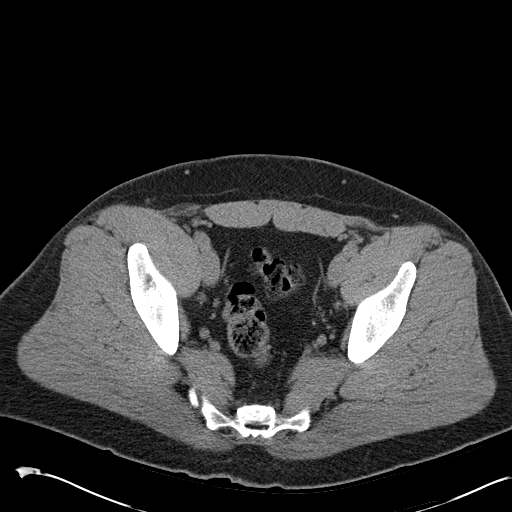
[im 25/90  soft-tissue]
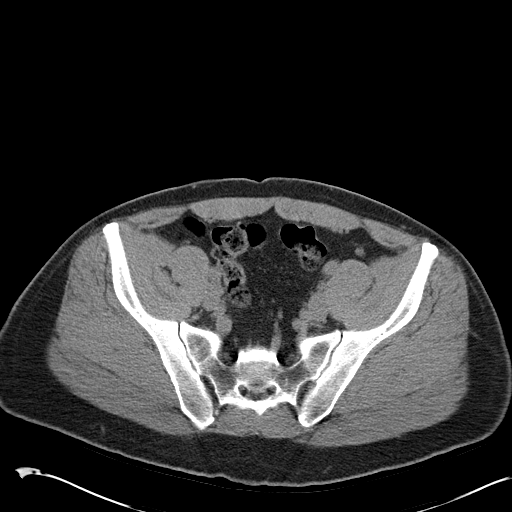
[im 29/90  soft-tissue]
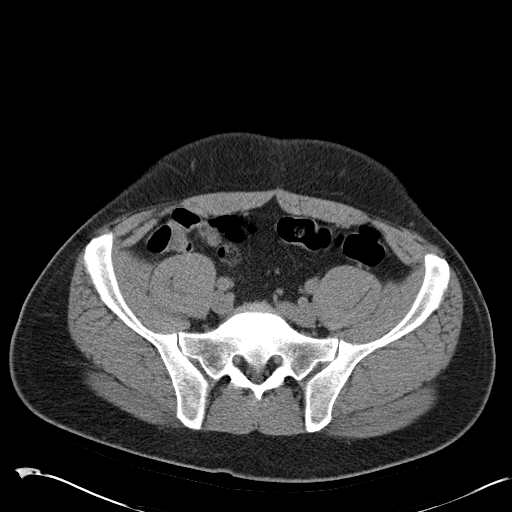
[im 36/90  soft-tissue]
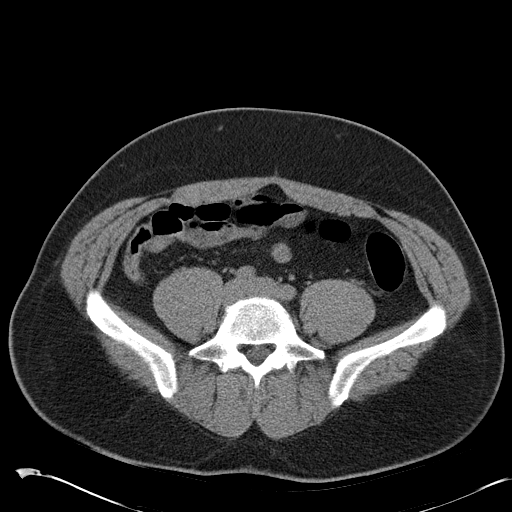
[im 43/90  soft-tissue]
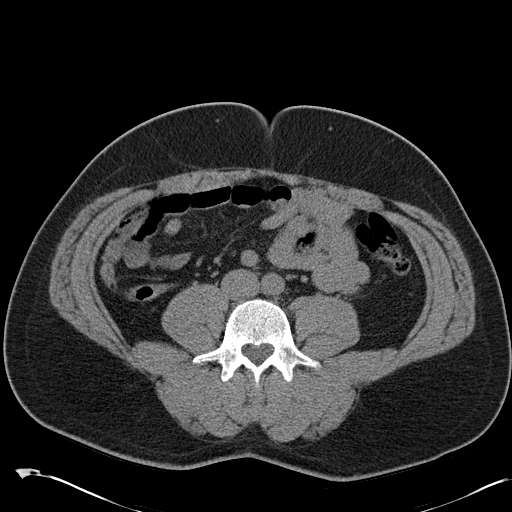
[im 47/90  soft-tissue]
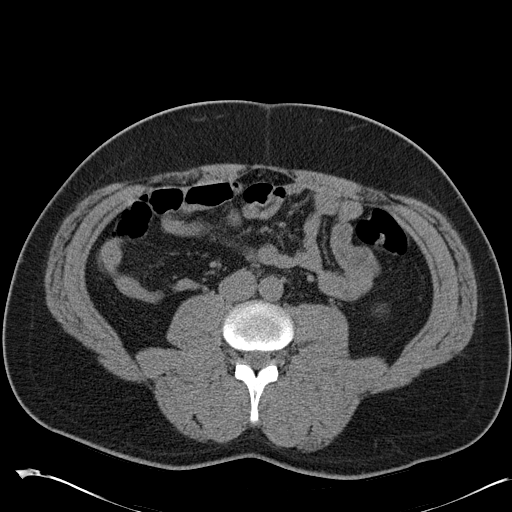
[im 54/90  soft-tissue]
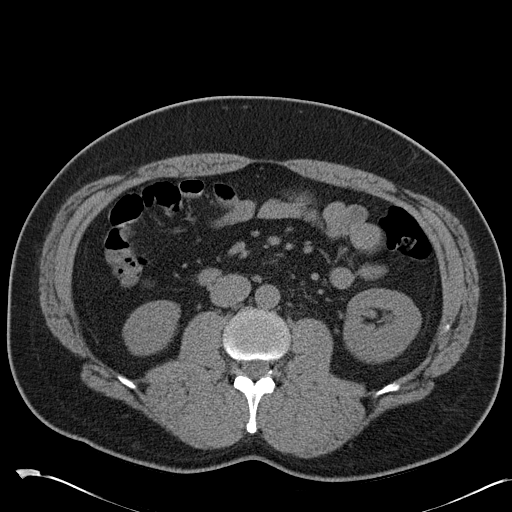
[im 54/90  bone]
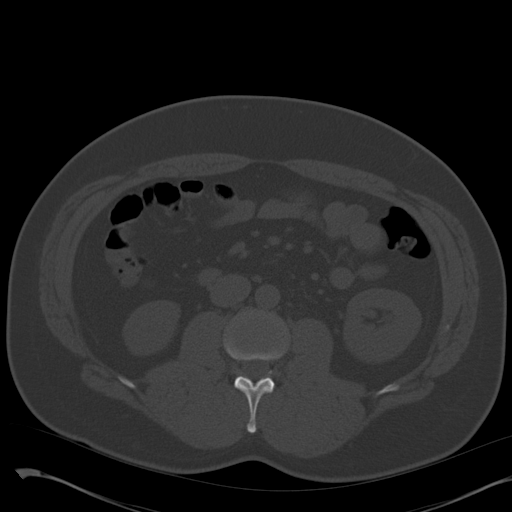
[im 61/90  soft-tissue]
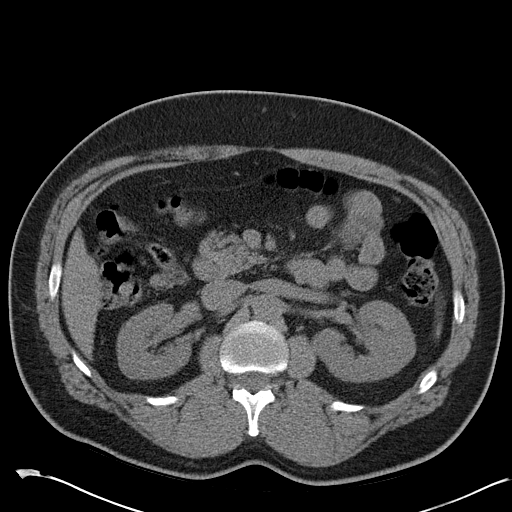
[im 68/90  soft-tissue]
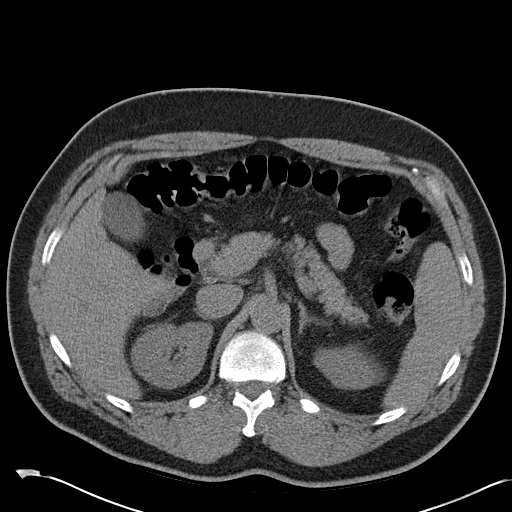
[im 72/90  soft-tissue]
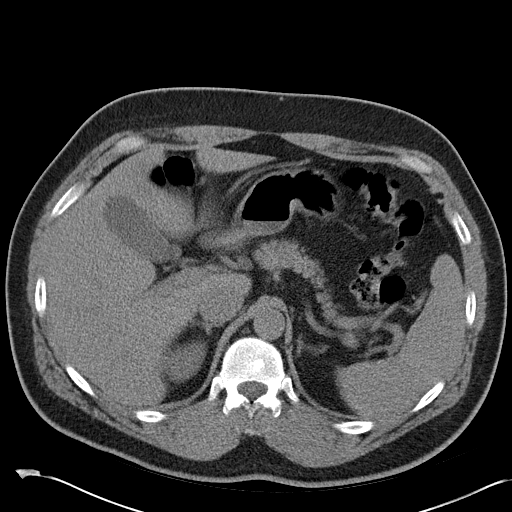
[im 79/90  soft-tissue]
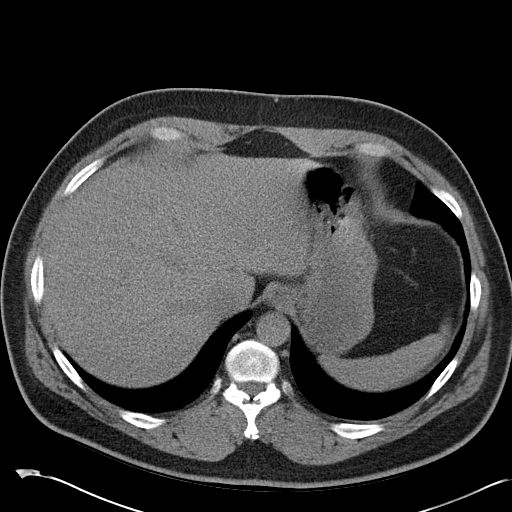
[im 86/90  soft-tissue]
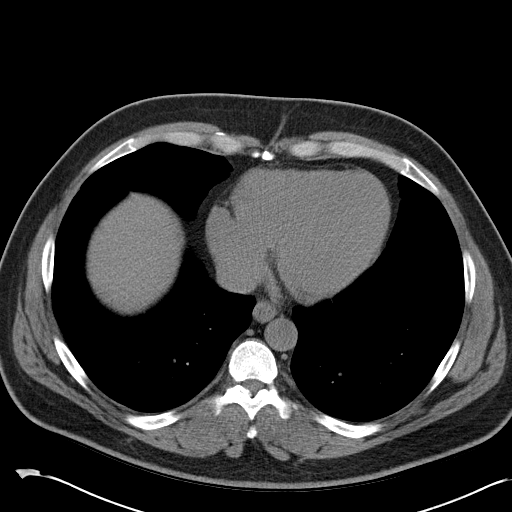

[Series 4: mpr coronal · coronal · 0.74mm/px · 3 of 91 slices shown]
[im 31/91  soft-tissue]
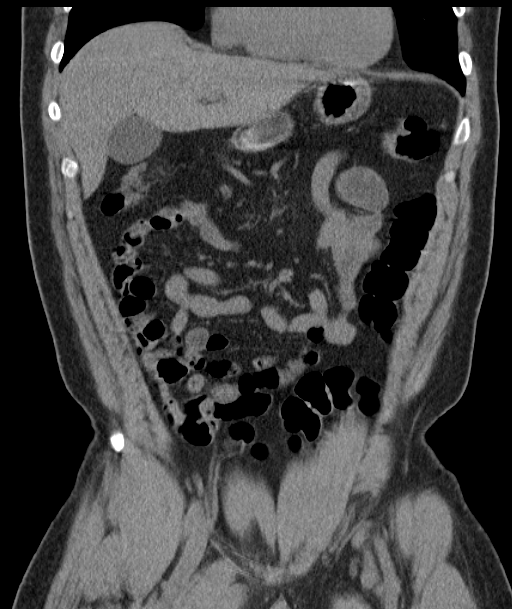
[im 41/91  soft-tissue]
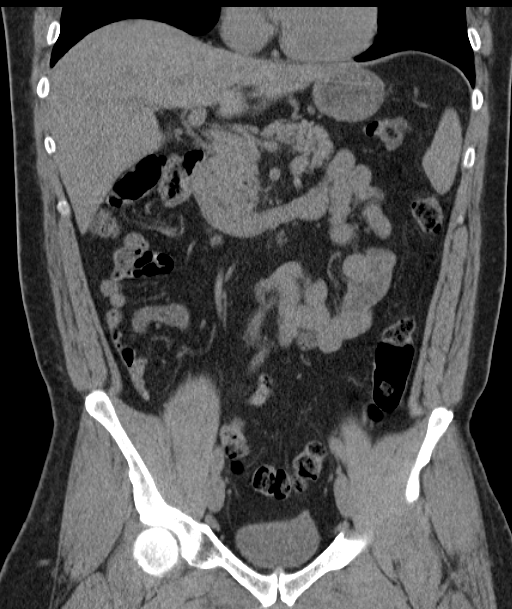
[im 51/91  soft-tissue]
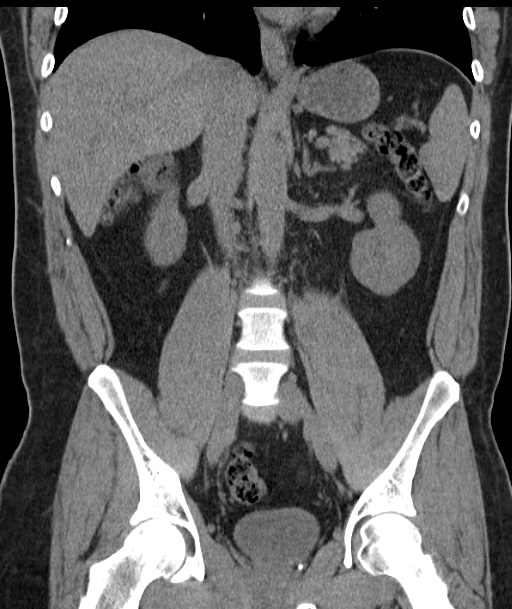

[17 of 46 positions shown; findings below may reference images not displayed]

FINDINGS: The lung bases are free of acute infiltrate or sizable effusion.

The liver, gallbladder, spleen, adrenal glands and pancreas are
within normal limits. The kidneys are well visualized bilaterally
and reveal no evidence of renal calculi. The ureters are well seen
and show no definitive obstructive change. The bladder is partially
distended with non-opacified urine. The appendix is in the right
upper quadrant due to mobile cecum but is otherwise within normal
limits. No significant diverticular change is seen. No free air or
free fluid is noted. No acute bony abnormality is noted.
IMPRESSION: No acute abnormality is noted.

## 2015-08-04 ENCOUNTER — Other Ambulatory Visit (HOSPITAL_COMMUNITY): Payer: Self-pay | Admitting: Internal Medicine

## 2015-08-04 DIAGNOSIS — R222 Localized swelling, mass and lump, trunk: Secondary | ICD-10-CM

## 2015-08-10 ENCOUNTER — Ambulatory Visit (HOSPITAL_COMMUNITY)
Admission: RE | Admit: 2015-08-10 | Discharge: 2015-08-10 | Disposition: A | Discharge status: Home / Self Care | Payer: 59 | Source: Ambulatory Visit | Attending: Internal Medicine | Admitting: Internal Medicine

## 2015-08-10 DIAGNOSIS — R222 Localized swelling, mass and lump, trunk: Secondary | ICD-10-CM | POA: Diagnosis present

## 2021-02-08 ENCOUNTER — Encounter: Payer: Self-pay | Admitting: *Deleted

## 2021-04-11 ENCOUNTER — Ambulatory Visit (INDEPENDENT_AMBULATORY_CARE_PROVIDER_SITE_OTHER): Payer: Self-pay | Admitting: *Deleted

## 2021-04-11 ENCOUNTER — Other Ambulatory Visit: Payer: Self-pay

## 2021-04-11 VITALS — Ht 69.5 in | Wt 249.4 lb

## 2021-04-11 DIAGNOSIS — Z1211 Encounter for screening for malignant neoplasm of colon: Secondary | ICD-10-CM

## 2021-04-11 MED ORDER — NA SULFATE-K SULFATE-MG SULF 17.5-3.13-1.6 GM/177ML PO SOLN: 1.0000 | Freq: Once | ORAL | 0 refills | Status: AC

## 2021-04-11 NOTE — Progress Notes (Signed)
Ok to schedule.  ASA II 

## 2021-04-11 NOTE — Progress Notes (Addendum)
Gastroenterology Pre-Procedure Review  Request Date: 04/11/2021 Requesting Physician: Ronnie Derby, NP, no previous TCS, family hx of colon cancer (uncle)  PATIENT REVIEW QUESTIONS: The patient responded to the following health history questions as indicated:    1. Diabetes Melitis: no 2. Joint replacements in the past 12 months: no 3. Major health problems in the past 3 months: no 4. Has an artificial valve or MVP: no 5. Has a defibrillator: no 6. Has been advised in past to take antibiotics in advance of a procedure like teeth cleaning: no 7. Family history of colon cancer: yes, uncle: age 37's  8. Alcohol Use: yes, 10 beers a month 9. Illicit drug Use: no 10. History of sleep apnea: no 11. History of coronary artery or other vascular stents placed within the last 12 months: no 12. History of any prior anesthesia complications: no 13. Body mass index is 36.3 kg/m.    MEDICATIONS & ALLERGIES:    Patient reports the following regarding taking any blood thinners:   Plavix? no Aspirin? no Coumadin? no Brilinta? no Xarelto? no Eliquis? no Pradaxa? no Savaysa? no Effient? no  Patient confirms/reports the following medications:  Current Outpatient Medications  Medication Sig Dispense Refill   ibuprofen (ADVIL) 200 MG tablet Take 200 mg by mouth as needed. Takes 2 tablets as needed.     losartan-hydrochlorothiazide (HYZAAR) 50-12.5 MG tablet Take 1 tablet by mouth daily.     UNABLE TO FIND as needed. Med Name: CBD cream     No current facility-administered medications for this visit.    Patient confirms/reports the following allergies:  No Known Allergies  No orders of the defined types were placed in this encounter.   AUTHORIZATION INFORMATION Primary Insurance: [facility],  [place] #: [id number],  Group #: [id number] Pre-Cert / Auth required: Yes, approved online [id number] Pre-Cert / Berkley Harvey #: [id number]  SCHEDULE INFORMATION: Procedure has been scheduled as follows:   Date: 05/04/2021, Time: 8:00  Location: [facility] with Dr. Marletta Lor  This Gastroenterology Pre-Precedure Review Form is being routed to the following provider(s): Tana Coast, PA-C

## 2021-04-11 NOTE — Patient Instructions (Addendum)
Russell Blackwell  [birth year] MRN: [id number]    Remember labs at [facility] on 05/02/2021.   Procedure Date: 05/04/2021 Time to register: 6:30 AM Place to register: [place] [facility] Scheduled provider: Dr. Abbey Chatters    PREPARATION FOR COLONOSCOPY WITH SUPREP BOWEL PREP KIT  Note: Suprep Bowel Prep Kit is a split-dose (2day) regimen. Consumption of BOTH 6-ounce bottles is required for a complete prep.  Please notify us immediately if you are diabetic, take iron supplements, or if you are on Coumadin or any other blood thinners.  Weight loss medications must be held 7 days prior to your procedure.   Please notify us of any medication changes at least 7 days prior to your procedure.   Please hold the following medications: n/a                                                                                                                                                  2 DAYS BEFORE PROCEDURE:  DATE: 05/02/2021   DAY: Wednesday Begin clear liquid diet AFTER your lunch meal. NO SOLID FOODS after this point.  1 DAY BEFORE PROCEDURE:  DATE: 05/03/2021   DAY: Thursday Continue clear liquids the entire day - NO SOLID FOOD.   Diabetic medications adjustments for today: n/a  At 8:00 am: Complete steps 1 through 4 below, using ONE (1) 6-ounce bottle. Step 1:  Pour ONE (1) 6-ounce bottle of SUPREP liquid into the mixing container.  Step 2:  Add cool drinking water to the 16 ounce line on the container and mix.  Note: Dilute the solution concentrate as directed prior to use. Step 3:  DRINK ALL the liquid in the container. Step 4:  You MUST drink an additional two (2) or more 16 ounce containers of water over the next one (1) hour.    At 6:00pm: Complete steps 1 through 4 below, using ONE (1) 6-ounce bottle, before going to bed. Step 1:  Pour ONE (1) 6-ounce bottle of SUPREP liquid into the mixing container.  Step 2:  Add cool drinking water to the 16 ounce line on the container and  mix.  Note: Dilute the solution concentrate as directed prior to use. Step 3:  DRINK ALL the liquid in the container. Step 4:  You MUST drink an additional two (2) or more 16 ounce containers of water over the next one (1) hour.   Continue clear liquids.  DAY OF PROCEDURE:   DATE: 05/04/2021   DAY: Friday If you take medications for your heart, blood pressure, or breathing, you may take these medications.  Diabetic medications adjustments for today: n/a   Nothing by mouth past 5:00 am.  You may take your morning medications with sip of water unless we have instructed otherwise.    Please see below for Dietary Information.  CLEAR LIQUIDS INCLUDE:  Water  Jello (NOT red in color)   Ice Popsicles (NOT red in color)   Tea (sugar ok, no milk/cream) Powdered fruit flavored drinks  Coffee (sugar ok, no milk/cream) Gatorade/ Lemonade/ Kool-Aid  (NOT red in color)   Juice: apple, white grape, white cranberry Soft drinks  Clear bullion, consomme, broth (fat free beef/chicken/vegetable)  Carbonated beverages (any kind)  Strained chicken noodle soup Hard Candy   Remember: Clear liquids are liquids that will allow you to see your fingers on the other side of a clear glass. Be sure liquids are NOT red in color, and not cloudy, but CLEAR.  DO NOT EAT OR DRINK ANY OF THE FOLLOWING:  Dairy products of any kind   Cranberry juice Tomato juice / V8 juice   Grapefruit juice Orange juice     Red grape juice  Do not eat any solid foods, including such foods as: cereal, oatmeal, yogurt, fruits, vegetables, creamed soups, eggs, bread, crackers, pureed foods in a blender, etc.   HELPFUL HINTS FOR DRINKING PREP SOLUTION:  Make sure prep is extremely cold. Mix and refrigerate the the morning of the prep. You may also put in the freezer.  You may try mixing some Crystal Light or Country Time Lemonade if you prefer. Mix in small amounts; add more if necessary. Try drinking through a straw Rinse mouth with  water or a mouthwash between glasses, to remove after-taste. Try sipping on a cold beverage /ice/ popsicles between glasses of prep. Place a piece of sugar-free hard candy in mouth between glasses. If you become nauseated, try consuming smaller amounts, or stretch out the time between glasses. Stop for 30-60 minutes, then slowly start back drinking.     OTHER INSTRUCTIONS  You will need to have a responsible adult immediately available after your procedure to receive discharge instructions then drive you home. We strongly encourage your responsible adult to remain in the hospital during your procedure.  Your procedure will be canceled if a responsible adult is not available.  Wear loose fitting clothing that is easily removed. Leave jewelry and other valuables at home.  Remove all body piercing jewelry and leave at home. Total time from sign-in until discharge is approximately 2-3 hours. You should go home directly after your procedure and rest. You can resume normal activities the day after your procedure. The day of your procedure you should not: Drive Make legal decisions Operate machinery Drink alcohol Return to work   You may call the office (Dept: [phone]) before 5:00pm, or page the doctor on call [phone]) after 5:00pm, for further instructions, if necessary.   Insurance Information YOU WILL NEED TO CHECK WITH YOUR INSURANCE COMPANY FOR THE BENEFITS OF COVERAGE YOU HAVE FOR THIS PROCEDURE.  UNFORTUNATELY, NOT ALL INSURANCE COMPANIES HAVE BENEFITS TO COVER ALL OR PART OF THESE TYPES OF PROCEDURES.  IT IS YOUR RESPONSIBILITY TO CHECK YOUR BENEFITS, HOWEVER, WE WILL BE GLAD TO ASSIST YOU WITH ANY CODES YOUR INSURANCE COMPANY MAY NEED.    PLEASE NOTE THAT MOST INSURANCE COMPANIES WILL NOT COVER A SCREENING COLONOSCOPY FOR PEOPLE UNDER THE AGE OF 50  IF YOU HAVE BCBS INSURANCE, YOU MAY HAVE BENEFITS FOR A SCREENING COLONOSCOPY BUT IF POLYPS ARE FOUND THE DIAGNOSIS WILL  CHANGE AND THEN YOU MAY HAVE A DEDUCTIBLE THAT WILL NEED TO BE MET. SO PLEASE MAKE SURE YOU CHECK YOUR BENEFITS FOR A SCREENING COLONOSCOPY AS WELL AS A DIAGNOSTIC COLONOSCOPY.

## 2021-04-12 ENCOUNTER — Other Ambulatory Visit: Payer: Self-pay | Admitting: *Deleted

## 2021-04-12 DIAGNOSIS — Z1211 Encounter for screening for malignant neoplasm of colon: Secondary | ICD-10-CM

## 2021-05-02 ENCOUNTER — Other Ambulatory Visit (HOSPITAL_COMMUNITY)
Admission: RE | Admit: 2021-05-02 | Discharge: 2021-05-02 | Disposition: A | Discharge status: Home / Self Care | Payer: 59 | Source: Ambulatory Visit | Attending: Internal Medicine | Admitting: Internal Medicine

## 2021-05-02 ENCOUNTER — Other Ambulatory Visit: Payer: Self-pay

## 2021-05-02 ENCOUNTER — Telehealth: Payer: Self-pay | Admitting: Internal Medicine

## 2021-05-02 DIAGNOSIS — Z1211 Encounter for screening for malignant neoplasm of colon: Secondary | ICD-10-CM | POA: Insufficient documentation

## 2021-05-02 LAB — BASIC METABOLIC PANEL
Anion gap: 7 (ref 5–15)
BUN: 15 mg/dL (ref 6–20)
CO2: 27 mmol/L (ref 22–32)
Calcium: 8.8 mg/dL — ABNORMAL LOW (ref 8.9–10.3)
Chloride: 104 mmol/L (ref 98–111)
Creatinine, Ser: 0.95 mg/dL (ref 0.61–1.24)
GFR, Estimated: >60 mL/min (ref >60)
Glucose, Bld: 100 mg/dL — ABNORMAL HIGH (ref 70–99)
Potassium: 4 mmol/L (ref 3.5–5.1)
Sodium: 138 mmol/L (ref 135–145)

## 2021-05-02 NOTE — Telephone Encounter (Signed)
Pt called asking for Elinor Parkinson, RMA. He has questions about his procedure Friday and taking nicotine lozenges. [phone]

## 2021-05-02 NOTE — Telephone Encounter (Signed)
Spoke to pt.  He wanted to go over his prep instructions again.  Reviewed them and answered all questions.  Pt said he spoke to someone from [facility] and they told him not to take his blood pressure medication the morning of procedure because it has a fluid combination in it.  He is concerned that his bp will be elevated if he doesn't take it.  Spoke to [employer], PA-C and she advised that pt call Day Surgery to express his concerns.  Gave pt the number to Day Surgery.

## 2021-05-04 ENCOUNTER — Ambulatory Visit (HOSPITAL_COMMUNITY)
Admission: RE | Admit: 2021-05-04 | Discharge: 2021-05-04 | Disposition: A | Discharge status: Home / Self Care | Payer: 59 | Attending: Internal Medicine | Admitting: Internal Medicine

## 2021-05-04 ENCOUNTER — Encounter (HOSPITAL_COMMUNITY): Payer: Self-pay

## 2021-05-04 ENCOUNTER — Other Ambulatory Visit: Payer: Self-pay

## 2021-05-04 ENCOUNTER — Ambulatory Visit (HOSPITAL_COMMUNITY): Payer: 59 | Admitting: Anesthesiology

## 2021-05-04 ENCOUNTER — Encounter (HOSPITAL_COMMUNITY)
Admission: RE | Disposition: A | Discharge status: Home / Self Care | Payer: Self-pay | Source: Home / Self Care | Attending: Internal Medicine

## 2021-05-04 DIAGNOSIS — Z87891 Personal history of nicotine dependence: Secondary | ICD-10-CM | POA: Diagnosis not present

## 2021-05-04 DIAGNOSIS — K648 Other hemorrhoids: Secondary | ICD-10-CM | POA: Diagnosis not present

## 2021-05-04 DIAGNOSIS — Z1211 Encounter for screening for malignant neoplasm of colon: Secondary | ICD-10-CM | POA: Insufficient documentation

## 2021-05-04 HISTORY — PX: COLONOSCOPY WITH PROPOFOL: SHX5780

## 2021-05-04 HISTORY — DX: Essential (primary) hypertension: I10

## 2021-05-04 SURGERY — COLONOSCOPY WITH PROPOFOL
Anesthesia: General

## 2021-05-04 MED ORDER — LACTATED RINGERS IV SOLN: INTRAVENOUS | Status: DC

## 2021-05-04 MED ORDER — PROPOFOL 500 MG/50ML IV EMUL
INTRAVENOUS | Status: DC | PRN
  Administered 2021-05-04: 150 ug/kg/min via INTRAVENOUS

## 2021-05-04 MED ORDER — PROPOFOL 10 MG/ML IV BOLUS
INTRAVENOUS | Status: DC | PRN
  Administered 2021-05-04: 100 mg via INTRAVENOUS

## 2021-05-04 NOTE — Transfer of Care (Signed)
Immediate Anesthesia Transfer of Care Note  Patient: Russell Blackwell  Procedure(s) Performed: COLONOSCOPY WITH PROPOFOL  Patient Location: Endoscopy Unit  Anesthesia Type:General  Level of Consciousness: drowsy  Airway & Oxygen Therapy: Patient Spontanous Breathing  Post-op Assessment: Report given to RN and Post -op Vital signs reviewed and stable  Post vital signs: Reviewed and stable  Last Vitals:  Vitals Value Taken Time  BP    Temp    Pulse    Resp    SpO2      Last Pain:  Vitals:   05/04/21 0636  TempSrc: Oral  PainSc: 0-No pain      Patients Stated Pain Goal: 7 (05/04/21 0636)  Complications: No notable events documented.

## 2021-05-04 NOTE — Discharge Instructions (Addendum)

## 2021-05-04 NOTE — H&P (Signed)
Primary Care Physician:  Benita Stabile, MD Primary Gastroenterologist:  Dr. Marletta Lor  Pre-Procedure History & Physical: HPI:  Russell Blackwell is a 50 y.o. male is here for a colonoscopy for colon cancer screening purposes.  Patient denies any family history of colorectal cancer.  No melena or hematochezia.  No abdominal pain or unintentional weight loss.  No change in bowel habits.  Overall feels well from a GI standpoint.  Past Medical History:  Diagnosis Date   Back pain    Hypertension     Past Surgical History:  Procedure Laterality Date   left ankle ORIF Left    left knee arthroscopy     ORTHOPEDIC SURGERY      Prior to Admission medications   Medication Sig Start Date End Date Taking? Authorizing Provider  losartan-hydrochlorothiazide (HYZAAR) 50-12.5 MG tablet Take 1 tablet by mouth daily. 04/05/21  Yes [provider]  UNABLE TO FIND Apply 1 application topically daily as needed (pain). Med Name: CBD cream   Yes [provider]  ibuprofen (ADVIL) 200 MG tablet Take 400 mg by mouth every 8 (eight) hours as needed for moderate pain.    [provider]    Allergies as of 04/12/2021   (No Known Allergies)    Family History  Problem Relation Age of Onset   Cancer Mother    Colon cancer Paternal Uncle     Social History   Socioeconomic History   Marital status: Divorced    Spouse name: Not on file   Number of children: Not on file   Years of education: Not on file   Highest education level: Not on file  Occupational History   Not on file  Tobacco Use   Smoking status: Never   Smokeless tobacco: Former    Types: Snuff  Vaping Use   Vaping Use: Never used  Substance and Sexual Activity   Alcohol use: Yes    Comment: Socially   Drug use: No   Sexual activity: Not on file  Other Topics Concern   Not on file  Social History Narrative   Not on file   Social Determinants of Health   Financial Resource Strain: Not on file  Food  Insecurity: Not on file  Transportation Needs: Not on file  Physical Activity: Not on file  Stress: Not on file  Social Connections: Not on file  Intimate Partner Violence: Not on file    Review of Systems: See HPI, otherwise negative ROS  Physical Exam: Vital signs in last 24 hours: Temp:  [97.9 F (36.6 C)] 97.9 F (36.6 C) (10/14 0636) Pulse Rate:  [70] 70 (10/14 0636) Resp:  [12] 12 (10/14 0636) BP: (137)/(84) 137/84 (10/14 0636) SpO2:  [97 %] 97 % (10/14 0636) Weight:  [111.1 kg] 111.1 kg (10/14 0636)   General:   Alert,  Well-developed, well-nourished, pleasant and cooperative in NAD Head:  Normocephalic and atraumatic. Eyes:  Sclera clear, no icterus.   Conjunctiva pink. Ears:  Normal auditory acuity. Nose:  No deformity, discharge,  or lesions. Mouth:  No deformity or lesions, dentition normal. Neck:  Supple; no masses or thyromegaly. Lungs:  Clear throughout to auscultation.   No wheezes, crackles, or rhonchi. No acute distress. Heart:  Regular rate and rhythm; no murmurs, clicks, rubs,  or gallops. Abdomen:  Soft, nontender and nondistended. No masses, hepatosplenomegaly or hernias noted. Normal bowel sounds, without guarding, and without rebound.   Msk:  Symmetrical without gross deformities. Normal posture. Extremities:  Without clubbing or edema. Neurologic:  Alert and  oriented x4;  grossly normal neurologically. Skin:  Intact without significant lesions or rashes. Cervical Nodes:  No significant cervical adenopathy. Psych:  Alert and cooperative. Normal mood and affect.  Impression/Plan: Russell Blackwell is here for a colonoscopy to be performed for colon cancer screening purposes.  The risks of the procedure including infection, bleed, or perforation as well as benefits, limitations, alternatives and imponderables have been reviewed with the patient. Questions have been answered. All parties agreeable.

## 2021-05-04 NOTE — Anesthesia Preprocedure Evaluation (Addendum)
Anesthesia Evaluation  Patient identified by MRN, date of birth, ID band Patient awake    Reviewed: Allergy & Precautions, NPO status , Patient's Chart, lab work & pertinent test results  Airway Mallampati: II  TM Distance: >3 FB Neck ROM: Full    Dental  (+) Dental Advisory Given, Missing   Pulmonary neg pulmonary ROS,    Pulmonary exam normal breath sounds clear to auscultation       Cardiovascular Exercise Tolerance: Good hypertension, Pt. on medications Normal cardiovascular exam Rhythm:Regular Rate:Normal     Neuro/Psych negative neurological ROS  negative psych ROS   GI/Hepatic negative GI ROS, (+)     substance abuse  alcohol use,   Endo/Other  negative endocrine ROS  Renal/GU negative Renal ROS  negative genitourinary   Musculoskeletal negative musculoskeletal ROS (+)   Abdominal   Peds negative pediatric ROS (+)  Hematology negative hematology ROS (+)   Anesthesia Other Findings Snoring   Reproductive/Obstetrics negative OB ROS                            Anesthesia Physical Anesthesia Plan  ASA: 2  Anesthesia Plan: General   Post-op Pain Management:    Induction: Intravenous  PONV Risk Score and Plan: TIVA  Airway Management Planned: Nasal Cannula and Natural Airway  Additional Equipment:   Intra-op Plan:   Post-operative Plan:   Informed Consent: I have reviewed the patients History and Physical, chart, labs and discussed the procedure including the risks, benefits and alternatives for the proposed anesthesia with the patient or authorized representative who has indicated his/her understanding and acceptance.     Dental advisory given  Plan Discussed with: CRNA and Surgeon  Anesthesia Plan Comments:         Anesthesia Quick Evaluation

## 2021-05-04 NOTE — Anesthesia Postprocedure Evaluation (Signed)
Anesthesia Post Note  Patient: Russell Blackwell  Procedure(s) Performed: COLONOSCOPY WITH PROPOFOL  Patient location during evaluation: Phase II Anesthesia Type: General Level of consciousness: awake and alert Pain management: pain level controlled Vital Signs Assessment: post-procedure vital signs reviewed and stable Respiratory status: spontaneous breathing, nonlabored ventilation and respiratory function stable Cardiovascular status: blood pressure returned to baseline and stable Postop Assessment: no apparent nausea or vomiting Anesthetic complications: no   No notable events documented.   Last Vitals:  Vitals:   05/04/21 0636 05/04/21 0824  BP: 137/84 103/62  Pulse: 70 64  Resp: 12 13  Temp: 36.6 C 36.5 C  SpO2: 97% 96%    Last Pain:  Vitals:   05/04/21 0824  TempSrc: Oral  PainSc: 0-No pain                 Garv Kuechle C Zakeria Kulzer

## 2021-05-04 NOTE — Op Note (Signed)
[facility] Patient Name: Russell Blackwell Procedure Date: 05/04/2021 8:02 AM MRN: [id number] Date of Birth: [birth year] Attending MD: Elon Alas. Abbey Chatters DO CSN: [id number] Age: 50 Admit Type: Outpatient Procedure:                Colonoscopy Indications:              Screening for colorectal malignant neoplasm Providers:                Elon Alas. Abbey Chatters, DO, Janeece Riggers, RN, Raphael Gibney, Technician Referring MD:              Medicines:                See the Anesthesia note for documentation of the                            administered medications Complications:            No immediate complications. Estimated Blood Loss:     Estimated blood loss: none. Procedure:                Pre-Anesthesia Assessment:                           - The anesthesia plan was to use monitored                            anesthesia care (MAC).                           After obtaining informed consent, the colonoscope                            was passed under direct vision. Throughout the                            procedure, the patient's blood pressure, pulse, and                            oxygen saturations were monitored continuously. The                            [id number] ([id number]) was introduced through the                            anus and advanced to the the cecum, identified by                            appendiceal orifice and ileocecal valve. The                            colonoscopy was performed without difficulty. The                            patient tolerated the procedure well. The quality  of the bowel preparation was evaluated using the                            BBPS [facility] Bowel Preparation Scale) with scores                            of: Right Colon = 3, Transverse Colon = 3 and Left                            Colon = 3 (entire mucosa seen well with no residual                            staining, small fragments  of stool or opaque                            liquid). The total BBPS score equals 9. Scope In: 8:09:34 AM Scope Out: 8:21:26 AM Scope Withdrawal Time: 0 hours 9 minutes 52 seconds  Total Procedure Duration: 0 hours 11 minutes 52 seconds  Findings:      The perianal and digital rectal examinations were normal.      Non-bleeding internal hemorrhoids were found during endoscopy.      The entire examined colon appeared normal. Impression:               - Non-bleeding internal hemorrhoids.                           - The entire examined colon is normal.                           - No specimens collected. Moderate Sedation:      Per Anesthesia Care Recommendation:           - Patient has a contact number available for                            emergencies. The signs and symptoms of potential                            delayed complications were discussed with the                            patient. Return to normal activities tomorrow.                            Written discharge instructions were provided to the                            patient.                           - Resume previous diet.                           - Continue present medications.                           -  Repeat colonoscopy in 10 years for screening                            purposes.                           - Return to GI clinic PRN. Procedure Code(s):        --- Professional ---                           T9774, Colorectal cancer screening; colonoscopy on                            individual not meeting criteria for high risk Diagnosis Code(s):        --- Professional ---                           Z12.11, Encounter for screening for malignant                            neoplasm of colon                           K64.8, Other hemorrhoids CPT copyright 2019 American Medical Association. All rights reserved. The codes documented in this report are preliminary and upon coder review may  be revised to meet  current compliance requirements. Elon Alas. Abbey Chatters, DO [place] Abbey Chatters, DO 05/04/2021 8:23:34 AM This report has been signed electronically. Number of Addenda: 0

## 2021-05-09 ENCOUNTER — Encounter (HOSPITAL_COMMUNITY): Payer: Self-pay | Admitting: Internal Medicine

## 2024-08-09 ENCOUNTER — Other Ambulatory Visit: Payer: Self-pay

## 2024-08-09 ENCOUNTER — Encounter (HOSPITAL_COMMUNITY): Payer: Self-pay | Admitting: Occupational Therapy

## 2024-08-09 ENCOUNTER — Ambulatory Visit (HOSPITAL_COMMUNITY): Attending: Internal Medicine | Admitting: Occupational Therapy

## 2024-08-09 DIAGNOSIS — M25612 Stiffness of left shoulder, not elsewhere classified: Secondary | ICD-10-CM | POA: Diagnosis present

## 2024-08-09 DIAGNOSIS — M25512 Pain in left shoulder: Secondary | ICD-10-CM | POA: Diagnosis present

## 2024-08-09 DIAGNOSIS — R29898 Other symptoms and signs involving the musculoskeletal system: Secondary | ICD-10-CM | POA: Diagnosis present

## 2024-08-09 DIAGNOSIS — G8929 Other chronic pain: Secondary | ICD-10-CM | POA: Diagnosis present

## 2024-08-09 NOTE — Patient Instructions (Signed)

## 2024-08-09 NOTE — Therapy (Signed)
 " OUTPATIENT OCCUPATIONAL THERAPY ORTHO EVALUATION  Patient Name: Russell Blackwell MRN: [id number] DOB:November 15, 1970, 54 y.o., male Today's Date: 08/09/2024   END OF SESSION:  OT End of Session - 08/09/24 1419     Visit Number 1    Number of Visits 8    Date for Recertification  09/08/24    Authorization Type [employer]    Authorization Time Period requesting auth    OT Start Time 1344    OT Stop Time 1415    OT Time Calculation (min) 31 min    Activity Tolerance Patient tolerated treatment well    Behavior During Therapy [facility] for tasks assessed/performed          Past Medical History:  Diagnosis Date   Back pain    Hypertension    Past Surgical History:  Procedure Laterality Date   COLONOSCOPY WITH PROPOFOL  N/A 05/04/2021   Procedure: COLONOSCOPY WITH PROPOFOL ;  Surgeon: Cindie Carlin POUR, DO;  [facility];  Service: Endoscopy;  Laterality: N/A;  8:00 / ASA II   left ankle ORIF Left    left knee arthroscopy     ORTHOPEDIC SURGERY     There are no active problems to display for this patient.   PCP: Dr. Norleen Hurst  REFERRING PROVIDER: Rosaline Macadam, FNP  ONSET DATE: worsening 6-12 months  REFERRING DIAG:   S49.92XS (ICD-10-CM) - Unspecified injury of left shoulder and upper arm, sequela    THERAPY DIAG:  Chronic left shoulder pain  Other symptoms and signs involving the musculoskeletal system  Stiffness of left shoulder, not elsewhere classified  Rationale for Evaluation and Treatment: Rehabilitation  SUBJECTIVE:   SUBJECTIVE STATEMENT: S: Several years ago I fell playing baseball and jammed my elbow and shoulder Pt accompanied by: self  PERTINENT HISTORY: Pt is a 54 y/o male presenting with left shoulder pain, chronic in nature but worsening over the past 6-12 months. Pt with history of labral issue that began when falling on his elbow and jamming his shoulder playing baseball a couple of years ago. Pt has not had an MRI or x-ray recently.    PRECAUTIONS: None  WEIGHT BEARING RESTRICTIONS: No  PAIN:  Are you having pain? No  FALLS: Has patient fallen in last 6 months? No  PLOF: Independent  PATIENT GOALS: To have improved mobility and pain  NEXT MD VISIT: None scheduled  OBJECTIVE:   HAND DOMINANCE: Right  ADLs: Overall ADLs: Pt reports difficulty reaching overhead, lifting out to the side or overhead. Pulling a bow back is difficult, more difficult than last year. Pt reports he is unable to sleep on his stomach and has to prop his left shoulder on pillows to sleep on his side. Pt is unable to reach behind his back. Has pain with quick motions and twisting motions. Any type of lifting causes tightening of the shoulder.    FUNCTIONAL OUTCOME MEASURES: UEFS  Extreme difficulty/unable (0), Quite a bit of difficulty (1), Moderate difficulty (2), Little difficulty (3), No difficulty (4) Survey date:  08/09/24  Any of your usual work, household or school activities 2  2. Your usual hobbies, recreational/sport activities 1   3. Lifting a bag of groceries to waist level 4   4. Lifting a bag of groceries above your head 1  5. Grooming your hair 1  6. Pushing up on your hands (I.e. from bathtub or chair) 0  7. Preparing food (I.e. peeling/cutting) 4  8. Driving  4  9. Vacuuming, sweeping, or  raking 3  10. Dressing  2  11. Doing up buttons 4  12. Using tools/appliances 3  13. Opening doors 4  14. Cleaning  2  15. Tying or lacing shoes 3  16. Sleeping  2  17. Laundering clothes (I.e. washing, ironing, folding) 4  18. Opening a jar 3  19. Throwing a ball 0  20. Carrying a small suitcase with your affected limb.  3  Score total:  50/80=63%      UPPER EXTREMITY ROM:       Assessed in sitting, er/IR adducted  Active ROM Left eval  Shoulder flexion 115  Shoulder abduction 115  Shoulder internal rotation 90  Shoulder external rotation 33  (Blank rows = not tested)    UPPER EXTREMITY MMT:     Assessed in  sitting, er/IR adducted  MMT Left eval  Shoulder flexion 4/5  Shoulder abduction 4+/5  Shoulder internal rotation 5/5  Shoulder external rotation 4+/5  (Blank rows = not tested)   SENSATION: WFL  EDEMA: None  COGNITION: Overall cognitive status: Within functional limits for tasks assessed  OBSERVATIONS: mod fascial restrictions at anterior shoulder and trapezius regions; tight end feel of joint capsule   TODAY'S TREATMENT:                                                                                                                              DATE:  Eval -AA/ROM: supine-protraction, flexion, abduction, horizontal abduction, er, 10 reps     PATIENT EDUCATION: Education details: AA/ROM Person educated: Patient Education method: Programmer, Multimedia, Facilities Manager, and Handouts Education comprehension: verbalized understanding and returned demonstration  HOME EXERCISE PROGRAM: Eval: AA/ROM  GOALS: Goals reviewed with patient? Yes   SHORT TERM GOALS: Target date: 09/08/24  Pt will be provided with and educated on HEP to improve mobility in LUE required for use during ADL completion.   Goal status: INITIAL  2.  Pt will increase LUE A/ROM by 30+ degrees to improve ability to use LUE when reaching overhead or behind back during dressing and bathing tasks  Goal status: INITIAL  3.   Pt will increase LUE strength to 5/5 throughout to improve ability to use LUE when lifting or carrying items during meal preparation/housework/yardwork tasks.    Goal status: INITIAL  4. Pt will decrease pain in LUE to 3/10 or less to improve ability to sleep for 2+ consecutive hours without waking due to pain.   Goal status: INITIAL  5.  Pt will decrease LUE fascial restrictions to min amounts or less to improve mobility required for functional reaching tasks.   Goal status: INITIAL     ASSESSMENT:  CLINICAL IMPRESSION: Patient is a 54 y.o. male who was seen today for occupational  therapy evaluation for left shoulder pain and limited mobility. Pt presents with increased pain and fascial restrictions, decreased ROM, strength, and functional use of the LUE. The issue is chronic in nature, however improved for about a year  after the initial injury, and over the last 6-12 months it has been worsening again. Educated on watching for signs of frozen shoulder as well as pt with tight end feel of shoulder joint. Pt will benefit from skilled OT services to improve functional use of the LUE as non-dominant during ADLs, leisure, and household tasks.    PERFORMANCE DEFICITS: in functional skills including in functional skills including ADLs, IADLs, coordination, tone, ROM, strength, pain, fascial restrictions, muscle spasms, and UE functional use  IMPAIRMENTS: are limiting patient from ADLs, IADLs, rest and sleep, work, and leisure.   COMORBIDITIES: has no other co-morbidities that affects occupational performance. Patient will benefit from skilled OT to address above impairments and improve overall function.  MODIFICATION OR ASSISTANCE TO COMPLETE EVALUATION: No modification of tasks or assist necessary to complete an evaluation.  OT OCCUPATIONAL PROFILE AND HISTORY: Problem focused assessment: Including review of records relating to presenting problem.  CLINICAL DECISION MAKING: LOW - limited treatment options, no task modification necessary  REHAB POTENTIAL: Good  EVALUATION COMPLEXITY: Low      PLAN:  OT FREQUENCY: 2x/week  OT DURATION: 4 weeks  PLANNED INTERVENTIONS: 97168 OT Re-evaluation, 97535 self care/ADL training, 02889 therapeutic exercise, 97530 therapeutic activity, 97112 neuromuscular re-education, 97140 manual therapy, 97035 ultrasound, 97014 electrical stimulation unattended, patient/family education, and DME and/or AE instructions  RECOMMENDED OTHER SERVICES: None at this time  CONSULTED AND AGREED WITH PLAN OF CARE: Patient  PLAN FOR NEXT SESSION:  Follow up on HEP, manual techniques as needed, shoulder stretches, AA/ROM progressing to A/ROM   Sonny Cory, OTR/L  [phone] August 30, 2024, 2:20 PM     Managed Medicaid Authorization Request Treatment Start Date: 2024-08-30  Visit Dx Codes: F74.487, G89.29, R29.898, M25.612  Functional Tool Score: UEFS: 63%  For all possible CPT codes, reference the Planned Interventions line above.     Check all conditions that are expected to impact treatment: {Conditions expected to impact treatment:None of these apply   If treatment provided at initial evaluation, no treatment charged due to lack of authorization.      "

## 2024-08-25 ENCOUNTER — Encounter (HOSPITAL_COMMUNITY): Payer: Self-pay | Admitting: Occupational Therapy

## 2024-08-25 ENCOUNTER — Ambulatory Visit (HOSPITAL_COMMUNITY): Admitting: Occupational Therapy

## 2024-08-25 DIAGNOSIS — M25612 Stiffness of left shoulder, not elsewhere classified: Secondary | ICD-10-CM

## 2024-08-25 DIAGNOSIS — R29898 Other symptoms and signs involving the musculoskeletal system: Secondary | ICD-10-CM

## 2024-08-25 DIAGNOSIS — G8929 Other chronic pain: Secondary | ICD-10-CM

## 2024-08-25 NOTE — Patient Instructions (Addendum)

## 2024-08-25 NOTE — Therapy (Signed)
 " OUTPATIENT OCCUPATIONAL THERAPY ORTHO TREATMENT  Patient Name: Russell Blackwell MRN: [id number] DOB:[birth year], 54 y.o., male Today's Date: 08/25/2024   END OF SESSION:  OT End of Session - 08/25/24 1315     Visit Number 2    Number of Visits 8    Date for Recertification  09/08/24    Authorization Type [employer]    Authorization Time Period requesting auth    OT Start Time 1137    OT Stop Time 1225    OT Time Calculation (min) 48 min    Activity Tolerance Patient tolerated treatment well    Behavior During Therapy [facility] for tasks assessed/performed           Past Medical History:  Diagnosis Date   Back pain    Hypertension    Past Surgical History:  Procedure Laterality Date   COLONOSCOPY WITH PROPOFOL  N/A 05/04/2021   Procedure: COLONOSCOPY WITH PROPOFOL ;  Surgeon: Cindie Carlin POUR, DO;  Location[facility];  Service: Endoscopy;  Laterality: N/A;  8:00 / ASA II   left ankle ORIF Left    left knee arthroscopy     ORTHOPEDIC SURGERY     There are no active problems to display for this patient.   PCP: Dr. Norleen Hurst  REFERRING PROVIDER: Rosaline Macadam, FNP  ONSET DATE: worsening 6-12 months  REFERRING DIAG:   S49.92XS (ICD-10-CM) - Unspecified injury of left shoulder and upper arm, sequela    THERAPY DIAG:  Chronic left shoulder pain  Other symptoms and signs involving the musculoskeletal system  Stiffness of left shoulder, not elsewhere classified  Rationale for Evaluation and Treatment: Rehabilitation  SUBJECTIVE:   SUBJECTIVE STATEMENT: S: I think the exercises have been helping a little Pt accompanied by: self  PERTINENT HISTORY: Pt is a 54 y/o male presenting with left shoulder pain, chronic in nature but worsening over the past 6-12 months. Pt with history of labral issue that began when falling on his elbow and jamming his shoulder playing baseball a couple of years ago. Pt has not had an MRI or x-ray recently.   PRECAUTIONS: None  WEIGHT  BEARING RESTRICTIONS: No  PAIN:  Are you having pain? No  FALLS: Has patient fallen in last 6 months? No  PLOF: Independent  PATIENT GOALS: To have improved mobility and pain  NEXT MD VISIT: None scheduled  OBJECTIVE:   HAND DOMINANCE: Right  ADLs: Overall ADLs: Pt reports difficulty reaching overhead, lifting out to the side or overhead. Pulling a bow back is difficult, more difficult than last year. Pt reports he is unable to sleep on his stomach and has to prop his left shoulder on pillows to sleep on his side. Pt is unable to reach behind his back. Has pain with quick motions and twisting motions. Any type of lifting causes tightening of the shoulder.    FUNCTIONAL OUTCOME MEASURES: UEFS  Extreme difficulty/unable (0), Quite a bit of difficulty (1), Moderate difficulty (2), Little difficulty (3), No difficulty (4) Survey date:  08/09/24  Any of your usual work, household or school activities 2  2. Your usual hobbies, recreational/sport activities 1   3. Lifting a bag of groceries to waist level 4   4. Lifting a bag of groceries above your head 1  5. Grooming your hair 1  6. Pushing up on your hands (I.e. from bathtub or chair) 0  7. Preparing food (I.e. peeling/cutting) 4  8. Driving  4  9. Vacuuming, sweeping, or raking 3  10. Dressing  2  11. Doing up buttons 4  12. Using tools/appliances 3  13. Opening doors 4  14. Cleaning  2  15. Tying or lacing shoes 3  16. Sleeping  2  17. Laundering clothes (I.e. washing, ironing, folding) 4  18. Opening a jar 3  19. Throwing a ball 0  20. Carrying a small suitcase with your affected limb.  3  Score total:  50/80=63%      UPPER EXTREMITY ROM:       Assessed in sitting, er/IR adducted  Active ROM Left eval  Shoulder flexion 115  Shoulder abduction 115  Shoulder internal rotation 90  Shoulder external rotation 33  (Blank rows = not tested)    UPPER EXTREMITY MMT:     Assessed in sitting, er/IR adducted  MMT  Left eval  Shoulder flexion 4/5  Shoulder abduction 4+/5  Shoulder internal rotation 5/5  Shoulder external rotation 4+/5  (Blank rows = not tested)   SENSATION: WFL  EDEMA: None  COGNITION: Overall cognitive status: Within functional limits for tasks assessed  OBSERVATIONS: mod fascial restrictions at anterior shoulder and trapezius regions; tight end feel of joint capsule   TODAY'S TREATMENT:                                                                                                                              DATE:  08/25/24 -Manual techniques: to anterior, posterior shoulder aspect to improve ROM, decrease pain and fascial restrictions   -AA/ROM: supine-protraction, flexion, abduction, horizontal abduction, er, 10 reps -A/ROM: supine-protraction, flexion, abduction, horizontal abduction, er, 10 reps -Proximal shoulder exercises: seated; paddles, criss cross, circles, reverse circles 30 seconds each movement  -Shoulder stretches: flexion, abduction, external/internal rotation, posterior shoulder capsule, chest stretch holding 10-15 seconds each stretch  -Theraband: red; retraction, row, extension 10x  -UBE: level 2 KPE at 5 or higher, 2 minutes forwards and then 2 minutes backwards   Eval -AA/ROM: supine-protraction, flexion, abduction, horizontal abduction, er, 10 reps     PATIENT EDUCATION: Education details: AA/ROM Person educated: Patient Education method: Programmer, Multimedia, Facilities Manager, and Handouts Education comprehension: verbalized understanding and returned demonstration  HOME EXERCISE PROGRAM: Eval: AA/ROM 2/4: shoulder stretches and proximal shoulder exercises   GOALS: Goals reviewed with patient? Yes   SHORT TERM GOALS: Target date: 09/08/24  Pt will be provided with and educated on HEP to improve mobility in LUE required for use during ADL completion.   Goal status: INITIAL  2.  Pt will increase LUE A/ROM by 30+ degrees to improve ability to use  LUE when reaching overhead or behind back during dressing and bathing tasks  Goal status: INITIAL  3.   Pt will increase LUE strength to 5/5 throughout to improve ability to use LUE when lifting or carrying items during meal preparation/housework/yardwork tasks.    Goal status: INITIAL  4. Pt will decrease pain in LUE to 3/10 or less to improve ability to sleep for  2+ consecutive hours without waking due to pain.   Goal status: INITIAL  5.  Pt will decrease LUE fascial restrictions to min amounts or less to improve mobility required for functional reaching tasks.   Goal status: INITIAL     ASSESSMENT:  CLINICAL IMPRESSION: Pt reports that his pain has been managed well with rest and heat at home. He reports that he has been completing AA/ROM HEP and it has been going well. Started session with myofasical release to improve ROM. Noted pt with 75% flexion and 50% abduction in standing. Went over shoulder stretching and added to HEP as well as proximal shoulder stability. PT would continue to benefit from OT services.    PERFORMANCE DEFICITS: in functional skills including in functional skills including ADLs, IADLs, coordination, tone, ROM, strength, pain, fascial restrictions, muscle spasms, and UE functional use  IMPAIRMENTS: are limiting patient from ADLs, IADLs, rest and sleep, work, and leisure.   COMORBIDITIES: has no other co-morbidities that affects occupational performance. Patient will benefit from skilled OT to address above impairments and improve overall function.  MODIFICATION OR ASSISTANCE TO COMPLETE EVALUATION: No modification of tasks or assist necessary to complete an evaluation.  OT OCCUPATIONAL PROFILE AND HISTORY: Problem focused assessment: Including review of records relating to presenting problem.  CLINICAL DECISION MAKING: LOW - limited treatment options, no task modification necessary  REHAB POTENTIAL: Good  EVALUATION COMPLEXITY:  Low      PLAN:  OT FREQUENCY: 2x/week  OT DURATION: 4 weeks  PLANNED INTERVENTIONS: 97168 OT Re-evaluation, 97535 self care/ADL training, 02889 therapeutic exercise, 97530 therapeutic activity, 97112 neuromuscular re-education, 97140 manual therapy, 97035 ultrasound, 97014 electrical stimulation unattended, patient/family education, and DME and/or AE instructions  RECOMMENDED OTHER SERVICES: None at this time  CONSULTED AND AGREED WITH PLAN OF CARE: Patient  PLAN FOR NEXT SESSION: Follow up on HEP, manual techniques as needed, shoulder stretches, AA/ROM progressing to A/ROM    Chiquita Sermon, OTR/L  [phone] 08/25/2024, 1:16 PM       "

## 2024-09-01 ENCOUNTER — Ambulatory Visit (HOSPITAL_COMMUNITY): Admitting: Occupational Therapy

## 2024-09-03 ENCOUNTER — Ambulatory Visit (HOSPITAL_COMMUNITY): Admitting: Occupational Therapy

## 2024-09-06 ENCOUNTER — Ambulatory Visit (HOSPITAL_COMMUNITY): Admitting: Occupational Therapy

## 2024-09-08 ENCOUNTER — Ambulatory Visit (HOSPITAL_COMMUNITY): Admitting: Occupational Therapy

## 2024-09-14 ENCOUNTER — Ambulatory Visit (HOSPITAL_COMMUNITY): Admitting: Occupational Therapy

## 2024-09-16 ENCOUNTER — Ambulatory Visit (HOSPITAL_COMMUNITY): Admitting: Occupational Therapy
